# Patient Record
Sex: Female | Born: 1940 | Race: White | Hispanic: Yes | Smoking: Never smoker
Health system: Southern US, Community
[De-identification: ages and names within clinical notes are randomized; demographics above are authoritative.]

## PROBLEM LIST (undated history)

## (undated) DIAGNOSIS — T7840XA Allergy, unspecified, initial encounter: Secondary | ICD-10-CM

## (undated) DIAGNOSIS — K6289 Other specified diseases of anus and rectum: Secondary | ICD-10-CM

## (undated) DIAGNOSIS — E785 Hyperlipidemia, unspecified: Secondary | ICD-10-CM

## (undated) DIAGNOSIS — F32A Depression, unspecified: Secondary | ICD-10-CM

## (undated) DIAGNOSIS — J40 Bronchitis, not specified as acute or chronic: Secondary | ICD-10-CM

## (undated) DIAGNOSIS — K5900 Constipation, unspecified: Secondary | ICD-10-CM

## (undated) DIAGNOSIS — F329 Major depressive disorder, single episode, unspecified: Secondary | ICD-10-CM

## (undated) DIAGNOSIS — M1990 Unspecified osteoarthritis, unspecified site: Secondary | ICD-10-CM

## (undated) DIAGNOSIS — I10 Essential (primary) hypertension: Secondary | ICD-10-CM

## (undated) DIAGNOSIS — I639 Cerebral infarction, unspecified: Secondary | ICD-10-CM

## (undated) HISTORY — DX: Other specified diseases of anus and rectum: K62.89

## (undated) HISTORY — DX: Depression, unspecified: F32.A

## (undated) HISTORY — PX: PARTIAL HYSTERECTOMY: SHX80

## (undated) HISTORY — DX: Hyperlipidemia, unspecified: E78.5

## (undated) HISTORY — DX: Cerebral infarction, unspecified: I63.9

## (undated) HISTORY — DX: Constipation, unspecified: K59.00

## (undated) HISTORY — DX: Bronchitis, not specified as acute or chronic: J40

## (undated) HISTORY — PX: COLONOSCOPY: SHX174

## (undated) HISTORY — DX: Allergy, unspecified, initial encounter: T78.40XA

## (undated) HISTORY — DX: Major depressive disorder, single episode, unspecified: F32.9

## (undated) HISTORY — PX: UPPER GASTROINTESTINAL ENDOSCOPY: SHX188

## (undated) HISTORY — DX: Unspecified osteoarthritis, unspecified site: M19.90

---

## 2009-11-10 ENCOUNTER — Ambulatory Visit: Payer: Self-pay | Admitting: Internal Medicine

## 2009-11-10 LAB — CONVERTED CEMR LAB
AST: 22 units/L (ref 0–37)
Albumin: 4.6 g/dL (ref 3.5–5.2)
BUN: 18 mg/dL (ref 6–23)
Basophils Relative: 0 % (ref 0–1)
Calcium: 10.1 mg/dL (ref 8.4–10.5)
Chloride: 100 meq/L (ref 96–112)
Creatinine, Ser: 0.93 mg/dL (ref 0.40–1.20)
Eosinophils Absolute: 0 10*3/uL (ref 0.0–0.7)
Folate: >20 ng/mL
Glucose, Bld: 85 mg/dL (ref 70–99)
Hemoglobin: 15.2 g/dL — ABNORMAL HIGH (ref 12.0–15.0)
Hgb A1c MFr Bld: 5.5 % (ref <5.7)
Lymphs Abs: 2.7 10*3/uL (ref 0.7–4.0)
MCHC: 34.5 g/dL (ref 30.0–36.0)
MCV: 88.7 fL (ref 78.0–100.0)
Microalb, Ur: 2.42 mg/dL — ABNORMAL HIGH (ref 0.00–1.89)
Monocytes Absolute: 0.5 10*3/uL (ref 0.1–1.0)
Monocytes Relative: 12 % (ref 3–12)
Neutro Abs: 1.3 10*3/uL — ABNORMAL LOW (ref 1.7–7.7)
Potassium: 4 meq/L (ref 3.5–5.3)
RBC: 4.97 M/uL (ref 3.87–5.11)
Vitamin B-12: 1010 pg/mL — ABNORMAL HIGH (ref 211–911)
WBC: 4.6 10*3/uL (ref 4.0–10.5)

## 2009-11-17 ENCOUNTER — Ambulatory Visit: Payer: Self-pay | Admitting: Internal Medicine

## 2009-11-17 LAB — CONVERTED CEMR LAB
HDL: 43 mg/dL (ref >39)
LDL Cholesterol: 99 mg/dL (ref 0–99)
Total CHOL/HDL Ratio: 4.4
VLDL: 48 mg/dL — ABNORMAL HIGH (ref 0–40)

## 2009-11-24 ENCOUNTER — Ambulatory Visit: Payer: Self-pay | Admitting: Internal Medicine

## 2009-11-24 LAB — CONVERTED CEMR LAB
ALT: 13 units/L (ref 0–35)
AST: 22 units/L (ref 0–37)
Albumin: 4.4 g/dL (ref 3.5–5.2)
Alkaline Phosphatase: 66 units/L (ref 39–117)
Hgb A1c MFr Bld: 5.6 % (ref <5.7)
Potassium: 3.8 meq/L (ref 3.5–5.3)
Sodium: 142 meq/L (ref 135–145)
Total Bilirubin: 0.5 mg/dL (ref 0.3–1.2)
Total Protein: 6.8 g/dL (ref 6.0–8.3)

## 2010-03-07 ENCOUNTER — Encounter (INDEPENDENT_AMBULATORY_CARE_PROVIDER_SITE_OTHER): Payer: Self-pay | Admitting: *Deleted

## 2010-03-07 LAB — CONVERTED CEMR LAB
Basophils Absolute: 0 10*3/uL (ref 0.0–0.1)
Eosinophils Absolute: 0.1 10*3/uL (ref 0.0–0.7)
Eosinophils Relative: 2 % (ref 0–5)
HCT: 42.3 % (ref 36.0–46.0)
Hemoglobin: 14.5 g/dL (ref 12.0–15.0)
Lymphocytes Relative: 58 % — ABNORMAL HIGH (ref 12–46)
MCHC: 34.3 g/dL (ref 30.0–36.0)
MCV: 87.6 fL (ref 78.0–100.0)
Monocytes Absolute: 0.4 10*3/uL (ref 0.1–1.0)
Platelets: 233 10*3/uL (ref 150–400)
RDW: 11.9 % (ref 11.5–15.5)
Vitamin B-12: 1160 pg/mL — ABNORMAL HIGH (ref 211–911)

## 2010-04-16 ENCOUNTER — Ambulatory Visit (HOSPITAL_COMMUNITY)
Admission: RE | Admit: 2010-04-16 | Discharge: 2010-04-16 | Payer: Self-pay | Source: Home / Self Care | Attending: Family Medicine | Admitting: Family Medicine

## 2010-04-19 ENCOUNTER — Encounter (INDEPENDENT_AMBULATORY_CARE_PROVIDER_SITE_OTHER): Payer: Self-pay | Admitting: *Deleted

## 2010-05-03 NOTE — Letter (Signed)
Summary: New Patient letter  [facility]  [address] [place], [place] [address]   Phone: [phone]  Fax: [phone]       04/19/2010 MRN: [id number]  [facility] [address] [place], [place]  [address]  Dear Ms. Wool,  Welcome to the Gastroenterology Division at [facility].    You are scheduled to see Dr.  Melvia Heaps on May 25, 2010 at 3:00pm on the 3rd floor at [facility], [address].  We ask that you try to arrive at our office 15 minutes prior to your appointment time to allow for check-in.  We would like you to complete the enclosed self-administered evaluation form prior to your visit and bring it with you on the day of your appointment.  We will review it with you.  Also, please bring a complete list of all your medications or, if you prefer, bring the medication bottles and we will list them.  Please bring your insurance card so that we may make a copy of it.  If your insurance requires a referral to see a specialist, please bring your referral form from your primary care physician.  Co-payments are due at the time of your visit and may be paid by cash, check or credit card.     Your office visit will consist of a consult with your physician (includes a physical exam), any laboratory testing he/she may order, scheduling of any necessary diagnostic testing (e.g. x-ray, ultrasound, CT-scan), and scheduling of a procedure (e.g. Endoscopy, Colonoscopy) if required.  Please allow enough time on your schedule to allow for any/all of these possibilities.    If you cannot keep your appointment, please call [phone] to cancel or reschedule prior to your appointment date.  This allows [place] the opportunity to schedule an appointment for another patient in need of care.  If you do not cancel or reschedule by 5 p.m. the business day prior to your appointment date, you will be charged a $50.00 late cancellation/no-show fee.    Thank you  for choosing [facility] for your medical needs.  We appreciate the opportunity to care for you.  Please visit [place] at our website  to learn more about our practice.                     Sincerely,                                                             The Gastroenterology Division

## 2010-05-04 ENCOUNTER — Encounter (INDEPENDENT_AMBULATORY_CARE_PROVIDER_SITE_OTHER): Payer: Self-pay | Admitting: *Deleted

## 2010-05-09 NOTE — Letter (Signed)
Summary: New Patient letter  [facility]  [address] [place], [place] [address]   Phone: [phone]  Fax: [phone]       05/04/2010 MRN: [id number]  [facility] [address] [place], [place]  [address]  Dear Sabrina Wilcox,  Welcome to the Gastroenterology Division at [facility].    You are scheduled to see Dr.  Russella Dar on 05-30-10 at 10:45A.M. on the 3rd floor at [facility], [address].  We ask that you try to arrive at our office 15 minutes prior to your appointment time to allow for check-in.  We would like you to complete the enclosed self-administered evaluation form prior to your visit and bring it with you on the day of your appointment.  We will review it with you.  Also, please bring a complete list of all your medications or, if you prefer, bring the medication bottles and we will list them.  Please bring your insurance card so that we may make a copy of it.  If your insurance requires a referral to see a specialist, please bring your referral form from your primary care physician.  Co-payments are due at the time of your visit and may be paid by cash, check or credit card.     Your office visit will consist of a consult with your physician (includes a physical exam), any laboratory testing he/she may order, scheduling of any necessary diagnostic testing (e.g. x-ray, ultrasound, CT-scan), and scheduling of a procedure (e.g. Endoscopy, Colonoscopy) if required.  Please allow enough time on your schedule to allow for any/all of these possibilities.    If you cannot keep your appointment, please call [phone] to cancel or reschedule prior to your appointment date.  This allows [place] the opportunity to schedule an appointment for another patient in need of care.  If you do not cancel or reschedule by 5 p.m. the business day prior to your appointment date, you will be charged a $50.00 late cancellation/no-show fee.    Thank you for choosing  [facility] for your medical needs.  We appreciate the opportunity to care for you.  Please visit [place] at our website  to learn more about our practice.                     Sincerely,                                                             The Gastroenterology Division

## 2010-05-25 ENCOUNTER — Ambulatory Visit: Payer: Self-pay | Admitting: Gastroenterology

## 2010-05-30 ENCOUNTER — Ambulatory Visit: Payer: Self-pay | Admitting: Gastroenterology

## 2010-06-08 ENCOUNTER — Inpatient Hospital Stay (INDEPENDENT_AMBULATORY_CARE_PROVIDER_SITE_OTHER)
Admission: RE | Admit: 2010-06-08 | Discharge: 2010-06-08 | Disposition: A | Discharge status: Home / Self Care | Payer: Self-pay | Source: Ambulatory Visit | Attending: Family Medicine | Admitting: Family Medicine

## 2010-06-08 ENCOUNTER — Ambulatory Visit (INDEPENDENT_AMBULATORY_CARE_PROVIDER_SITE_OTHER): Payer: Self-pay

## 2010-06-08 DIAGNOSIS — R51 Headache: Secondary | ICD-10-CM

## 2010-06-08 LAB — POCT I-STAT, CHEM 8
BUN: 20 mg/dL (ref 6–23)
Calcium, Ion: 1.2 mmol/L (ref 1.12–1.32)
Chloride: 103 mEq/L (ref 96–112)
HCT: 40 % (ref 36.0–46.0)
Sodium: 139 mEq/L (ref 135–145)

## 2010-06-08 LAB — POCT URINALYSIS DIPSTICK
Ketones, ur: NEGATIVE mg/dL
Protein, ur: NEGATIVE mg/dL
Specific Gravity, Urine: 1.015 (ref 1.005–1.030)
Urobilinogen, UA: 0.2 mg/dL (ref 0.0–1.0)
pH: 7 (ref 5.0–8.0)

## 2010-06-09 LAB — URINE CULTURE: Colony Count: 15000

## 2010-08-15 ENCOUNTER — Ambulatory Visit (HOSPITAL_COMMUNITY)
Admission: RE | Admit: 2010-08-15 | Discharge: 2010-08-15 | Disposition: A | Discharge status: Home / Self Care | Payer: Self-pay | Source: Ambulatory Visit | Attending: Family Medicine | Admitting: Family Medicine

## 2010-08-15 ENCOUNTER — Other Ambulatory Visit (HOSPITAL_COMMUNITY): Payer: Self-pay | Admitting: Family Medicine

## 2010-08-15 DIAGNOSIS — M25569 Pain in unspecified knee: Secondary | ICD-10-CM | POA: Insufficient documentation

## 2010-08-15 DIAGNOSIS — IMO0002 Reserved for concepts with insufficient information to code with codable children: Secondary | ICD-10-CM | POA: Insufficient documentation

## 2010-08-15 DIAGNOSIS — M899 Disorder of bone, unspecified: Secondary | ICD-10-CM | POA: Insufficient documentation

## 2010-08-15 DIAGNOSIS — R52 Pain, unspecified: Secondary | ICD-10-CM

## 2010-08-15 DIAGNOSIS — M949 Disorder of cartilage, unspecified: Secondary | ICD-10-CM | POA: Insufficient documentation

## 2010-08-15 DIAGNOSIS — M1710 Unilateral primary osteoarthritis, unspecified knee: Secondary | ICD-10-CM | POA: Insufficient documentation

## 2010-11-26 ENCOUNTER — Ambulatory Visit: Payer: Self-pay | Attending: Family Medicine | Admitting: Physical Therapy

## 2010-11-26 DIAGNOSIS — M6281 Muscle weakness (generalized): Secondary | ICD-10-CM | POA: Insufficient documentation

## 2010-11-26 DIAGNOSIS — M2560 Stiffness of unspecified joint, not elsewhere classified: Secondary | ICD-10-CM | POA: Insufficient documentation

## 2010-11-26 DIAGNOSIS — IMO0001 Reserved for inherently not codable concepts without codable children: Secondary | ICD-10-CM | POA: Insufficient documentation

## 2010-11-26 DIAGNOSIS — M2550 Pain in unspecified joint: Secondary | ICD-10-CM | POA: Insufficient documentation

## 2010-11-26 DIAGNOSIS — R293 Abnormal posture: Secondary | ICD-10-CM | POA: Insufficient documentation

## 2010-12-06 ENCOUNTER — Ambulatory Visit: Payer: Self-pay | Attending: Family Medicine

## 2010-12-06 DIAGNOSIS — IMO0001 Reserved for inherently not codable concepts without codable children: Secondary | ICD-10-CM | POA: Insufficient documentation

## 2010-12-06 DIAGNOSIS — M6281 Muscle weakness (generalized): Secondary | ICD-10-CM | POA: Insufficient documentation

## 2010-12-06 DIAGNOSIS — M2560 Stiffness of unspecified joint, not elsewhere classified: Secondary | ICD-10-CM | POA: Insufficient documentation

## 2010-12-06 DIAGNOSIS — M2550 Pain in unspecified joint: Secondary | ICD-10-CM | POA: Insufficient documentation

## 2010-12-06 DIAGNOSIS — R293 Abnormal posture: Secondary | ICD-10-CM | POA: Insufficient documentation

## 2010-12-10 ENCOUNTER — Encounter: Payer: Self-pay | Admitting: Physical Therapy

## 2010-12-13 ENCOUNTER — Encounter: Payer: Self-pay | Admitting: Physical Therapy

## 2011-01-09 ENCOUNTER — Inpatient Hospital Stay (INDEPENDENT_AMBULATORY_CARE_PROVIDER_SITE_OTHER)
Admission: RE | Admit: 2011-01-09 | Discharge: 2011-01-09 | Disposition: A | Discharge status: Home / Self Care | Payer: Self-pay | Source: Ambulatory Visit | Attending: Emergency Medicine | Admitting: Emergency Medicine

## 2011-01-09 ENCOUNTER — Emergency Department (HOSPITAL_COMMUNITY): Payer: Self-pay

## 2011-01-09 ENCOUNTER — Emergency Department (HOSPITAL_COMMUNITY)
Admission: EM | Admit: 2011-01-09 | Discharge: 2011-01-10 | Disposition: A | Discharge status: Home / Self Care | Payer: Self-pay | Attending: Emergency Medicine | Admitting: Emergency Medicine

## 2011-01-09 DIAGNOSIS — R110 Nausea: Secondary | ICD-10-CM | POA: Insufficient documentation

## 2011-01-09 DIAGNOSIS — I10 Essential (primary) hypertension: Secondary | ICD-10-CM | POA: Insufficient documentation

## 2011-01-09 DIAGNOSIS — K859 Acute pancreatitis without necrosis or infection, unspecified: Secondary | ICD-10-CM | POA: Insufficient documentation

## 2011-01-09 DIAGNOSIS — R1013 Epigastric pain: Secondary | ICD-10-CM | POA: Insufficient documentation

## 2011-01-09 DIAGNOSIS — K819 Cholecystitis, unspecified: Secondary | ICD-10-CM

## 2011-01-09 HISTORY — DX: Essential (primary) hypertension: I10

## 2011-01-09 LAB — COMPREHENSIVE METABOLIC PANEL
ALT: 15 U/L (ref 0–35)
Alkaline Phosphatase: 93 U/L (ref 39–117)
CO2: 28 mEq/L (ref 19–32)
Chloride: 104 mEq/L (ref 96–112)
GFR calc Af Amer: 78 mL/min — ABNORMAL LOW (ref >90)
GFR calc non Af Amer: 67 mL/min — ABNORMAL LOW (ref >90)
Glucose, Bld: 110 mg/dL — ABNORMAL HIGH (ref 70–99)
Potassium: 4.1 mEq/L (ref 3.5–5.1)
Sodium: 141 mEq/L (ref 135–145)
Total Bilirubin: 0.4 mg/dL (ref 0.3–1.2)
Total Protein: 7.7 g/dL (ref 6.0–8.3)

## 2011-01-09 LAB — URINE MICROSCOPIC-ADD ON

## 2011-01-09 LAB — DIFFERENTIAL
Basophils Absolute: 0 10*3/uL (ref 0.0–0.1)
Basophils Relative: 1 % (ref 0–1)
Lymphocytes Relative: 44 % (ref 12–46)
Neutro Abs: 2.3 10*3/uL (ref 1.7–7.7)
Neutrophils Relative %: 46 % (ref 43–77)

## 2011-01-09 LAB — URINALYSIS, ROUTINE W REFLEX MICROSCOPIC
Bilirubin Urine: NEGATIVE
Glucose, UA: NEGATIVE mg/dL
Hgb urine dipstick: NEGATIVE
Protein, ur: NEGATIVE mg/dL
Specific Gravity, Urine: 1.009 (ref 1.005–1.030)

## 2011-01-09 LAB — CBC
HCT: 40.3 % (ref 36.0–46.0)
Hemoglobin: 14.4 g/dL (ref 12.0–15.0)
RBC: 4.77 MIL/uL (ref 3.87–5.11)

## 2011-01-10 ENCOUNTER — Encounter (HOSPITAL_COMMUNITY): Payer: Self-pay | Admitting: Radiology

## 2011-01-10 MED ORDER — IOHEXOL 300 MG/ML  SOLN
100.0000 mL | Freq: Once | INTRAMUSCULAR | Status: AC | PRN
  Administered 2011-01-10: 100 mL via INTRAVENOUS

## 2011-01-23 NOTE — H&P (Signed)
Sabrina Wilcox, Sabrina Wilcox              ACCOUNT NO.:  [id number]  MEDICAL RECORD NO.:  [id number]  LOCATION:  [facility]                         FACILITY:  [facility]  PHYSICIAN:  Juan H. Lily Peer, M.D.DATE OF BIRTH:  1940-10-18  DATE OF ADMISSION:  01/09/2011 DATE OF DISCHARGE:  01/10/2011                             HISTORY & PHYSICAL   The patient is scheduled for surgery on Friday, October 12th at 1 p.m. at [facility].  Please have history and physical available.  CHIEF COMPLAINT:  Symptomatic submucous myoma.  HISTORY:  The patient is a 70 year old gravida 0, who was seen in the office on October 17, 2010, due to the fact that her main complaint of 3- month history of left lower quadrant discomfort radiating towards her back.  She had an ultrasound at that visit which had demonstrated the following.  The uterus measured 8.4 x 5.4 x 4.1 cm with 2 small 1 cm intramural myomas and 1 subserosal measuring 1.5 cm.  Questionable intracavitary myoma was documented.  The left ovary had a solid cystic cyst measuring 3.7 x 3.3 x 3.7 cm with positive color flow within the mass and the periphery.  The patient was instructed to follow up in the office after 2 cycles to see if there has been resolution of the ovarian cyst and also did do a sonohysterogram to better assess the intrauterine cavity.  The patient stated that her symptoms had resolved and her periods sometimes may be heavy, but no intermenstrual bleeding had been reported.  On that office visit on July 18th, she had done an endometrial biopsy which demonstrated no evidence of hyperplasia or malignancy only a benign secretory endometrium.  We proceeded with doing a sonohysterogram on the office visit of September 7th which demonstrated the uterus measuring 7.7 x 5.0 x 3.9 cm with an endometrial stripe of 16.1 mm.  The patient is currently on day 10 of her cycle. Once again multiple small fibroids were noted, but the  sonohysterogram did confirm that she did have an intracavitary fibroid which measured approximately 2 cm in size.  Color flow Doppler was noted at the pedicle which was located anterior to the uterine wall.  PAST MEDICAL HISTORY:  The patient denies any allergy.  OBSTETRICAL/GYNECOLOGICAL HISTORY: 1. History of fibroid uterus. 2. Dysmenorrhea. 3. Menorrhagia.  MEDICATIONS:  Consist of iron supplementation.  MEDICAL CONDITIONS: 1. Anemia. 2. Past history of H. pylori.  No prior surgery.  FAMILY HISTORY:  Negative.  REVIEW OF SYSTEMS:  Essentially unremarkable with the exception of items mentioned in the history of present illness.  PHYSICAL EXAMINATION:  VITAL SIGNS:  The patient weighs 177 pounds, 5 feet and 3.5 inches tall.  BMI 31. HEENT:  Was unremarkable. NECK:  Supple.  Trachea midline.  No carotid bruits.  No thyromegaly. LUNGS:  Clear to auscultation.  No rhonchi or wheezes. HEART:  Regular rate and rhythm.  No murmurs or gallops. BREAST EXAM:  Not done. ABDOMEN:  Soft, nontender.  No rebound or guarding. PELVIC:  Bartholin, urethra, Skene glands within normal limits.  Vagina and cervix, no gross lesions on inspection.  Uterus slightly irregular, upper limits of normal.  No palpable adnexal masses.  RECTAL EXAM:  Deferred.  ASSESSMENT:  A 70 year old gravida 0 with submucous myoma, scheduled for resectoscopic myomectomy.  The risks, benefits, and pros and cons of the operation were discussed with the patient in Spanish to include the risk of uterine perforation, requiring emergency laparotomy, the risk of hemorrhage if she would need blood or blood products.  She is fully aware of the potential risk of anaphylactic reaction, hepatitis, and acquired immune deficiency syndrome.  Also the risk of deep venous thrombosis and pulmonary embolism.  She will receive __________stockings for deep venous thrombosis prophylaxis and also receive intravenous antibiotics as well.   All these issues were discussed with the patient in the event of uncontrollable hemorrhage and she would need emergency hysterectomy.  She is fully aware that she will not be able to have any children after this, although she is 89 years of age.  She fully understands all the above risks, literature information has previously been provided and all of her questions were answered in detail in Spanish and will follow accordingly.  PLAN:  The patient is scheduled for resectoscopic myomectomy on Friday, October 12th at 1 p.m. at [facility].  Please have history and physical available.     Juan H. Lily Peer, M.D.     JHF/MEDQ  D:  01/10/2011  T:  01/10/2011  Job:  [id number]  Electronically Signed by Reynaldo Minium M.D. on 01/23/2011 02:29:49 PM

## 2015-05-15 ENCOUNTER — Ambulatory Visit: Payer: Self-pay | Attending: Internal Medicine

## 2015-06-01 ENCOUNTER — Emergency Department (HOSPITAL_COMMUNITY)
Admission: EM | Admit: 2015-06-01 | Discharge: 2015-06-01 | Disposition: A | Discharge status: Home / Self Care | Payer: No Typology Code available for payment source | Source: Home / Self Care | Attending: Family Medicine | Admitting: Family Medicine

## 2015-06-01 DIAGNOSIS — I10 Essential (primary) hypertension: Secondary | ICD-10-CM

## 2015-06-01 LAB — POCT I-STAT, CHEM 8
BUN: 22 mg/dL — ABNORMAL HIGH (ref 6–20)
CALCIUM ION: 1.13 mmol/L (ref 1.13–1.30)
CREATININE: 0.9 mg/dL (ref 0.44–1.00)
Chloride: 101 mmol/L (ref 101–111)
GLUCOSE: 96 mg/dL (ref 65–99)
HCT: 42 % (ref 36.0–46.0)
HEMOGLOBIN: 14.3 g/dL (ref 12.0–15.0)
POTASSIUM: 3.9 mmol/L (ref 3.5–5.1)
Sodium: 140 mmol/L (ref 135–145)
TCO2: 30 mmol/L (ref 0–100)

## 2015-06-01 MED ORDER — AMLODIPINE BESYLATE 5 MG PO TABS: 5.0000 mg | ORAL_TABLET | Freq: Every day | ORAL | Status: DC

## 2015-06-01 MED ORDER — LISINOPRIL 10 MG PO TABS: 10.0000 mg | ORAL_TABLET | Freq: Every day | ORAL | Status: DC

## 2015-06-01 MED FILL — ?LISINOPRIL 10 MG TABLET: 10 | 30 days supply | Qty: 30 | Fill #0

## 2015-06-01 MED FILL — ?AMLODIPINE BESYLATE 5 MG T: 5 | 30 days supply | Qty: 30 | Fill #0

## 2015-06-01 NOTE — ED Provider Notes (Signed)
CSN: [id number]     Arrival date & time 06/01/15  1426 History   First MD Initiated Contact with Patient 06/01/15 1557     No chief complaint on file.  (Consider location/radiation/quality/duration/timing/severity/associated sxs/prior Treatment) Patient is a 75 y.o. female presenting with hypertension. The history is provided by a relative. The history is limited by a language barrier. A language interpreter was used (daughter and grdaughter trans, ).  Hypertension This is a chronic problem. Episode onset: not going back to [place], sees doctor in [place] for hbp, out of meds x 2 weeks, has appt 3/21 at [facility]. The problem has not changed since onset.Pertinent negatives include no chest pain, no headaches and no shortness of breath.    Past Medical History  Diagnosis Date  . Hypertension    No past surgical history on file. No family history on file. Social History  Substance Use Topics  . Smoking status: Not on file  . Smokeless tobacco: Not on file  . Alcohol Use: Not on file   OB History    No data available     Review of Systems  Constitutional: Negative.   Respiratory: Negative for cough, shortness of breath and wheezing.   Cardiovascular: Negative for chest pain and leg swelling.  Neurological: Negative for headaches.  All other systems reviewed and are negative.   Allergies  Review of patient's allergies indicates no known allergies.  Home Medications   Prior to Admission medications   Not on File   Meds Ordered and Administered this Visit  Medications - No data to display  BP 194/81 mmHg  Pulse 70  Temp(Src) 97.3 F (36.3 C) (Oral)  Resp 16  SpO2 99% No data found.   Physical Exam  Constitutional: She is oriented to person, place, and time. She appears well-developed and well-nourished. No distress.  Neck: Normal range of motion. Neck supple.  Cardiovascular: Normal rate, regular rhythm, normal heart sounds and intact distal pulses.    Pulmonary/Chest: Effort normal and breath sounds normal.  Musculoskeletal: She exhibits no edema.  Lymphadenopathy:    She has no cervical adenopathy.  Neurological: She is alert and oriented to person, place, and time.  Skin: Skin is warm and dry.  Nursing note and vitals reviewed.   ED Course  Procedures (including critical care time)  Labs Review Labs Reviewed - No data to display i-stat wnl.  Imaging Review No results found.   Visual Acuity Review  Right Eye Distance:   Left Eye Distance:   Bilateral Distance:    Right Eye Near:   Left Eye Near:    Bilateral Near:         MDM  No diagnosis found.  Meds ordered this encounter  Medications  . amLODipine (NORVASC) 5 MG tablet    Sig: Take 1 tablet (5 mg total) by mouth daily.    Dispense:  30 tablet    Refill:  0  . lisinopril (PRINIVIL,ZESTRIL) 10 MG tablet    Sig: Take 1 tablet (10 mg total) by mouth daily.    Dispense:  30 tablet    Refill:  0      Billy Fischer, MD 06/01/15 [phone]

## 2015-06-01 NOTE — Discharge Instructions (Signed)
Take your medicine daily, see your doctor for recheck as scheduled.

## 2015-06-01 NOTE — ED Notes (Signed)
Pt  Reports       Headache  And  Arm pain         She   Is recently  To  The  Area  From  [place]  And  She  Is  Out  Of  Her  bp  Medication   She  Has  An appt  21   March  At the  [facility]  She  Needs  meds  To  Last  Her    Until   She    pt  Reports

## 2015-06-02 ENCOUNTER — Emergency Department (HOSPITAL_COMMUNITY)
Admission: EM | Admit: 2015-06-02 | Discharge: 2015-06-03 | Disposition: A | Discharge status: Home / Self Care | Payer: No Typology Code available for payment source | Attending: Emergency Medicine | Admitting: Emergency Medicine

## 2015-06-02 DIAGNOSIS — Y998 Other external cause status: Secondary | ICD-10-CM | POA: Insufficient documentation

## 2015-06-02 DIAGNOSIS — Y9389 Activity, other specified: Secondary | ICD-10-CM | POA: Insufficient documentation

## 2015-06-02 DIAGNOSIS — N390 Urinary tract infection, site not specified: Secondary | ICD-10-CM | POA: Insufficient documentation

## 2015-06-02 DIAGNOSIS — S0990XA Unspecified injury of head, initial encounter: Secondary | ICD-10-CM | POA: Insufficient documentation

## 2015-06-02 DIAGNOSIS — W01198A Fall on same level from slipping, tripping and stumbling with subsequent striking against other object, initial encounter: Secondary | ICD-10-CM | POA: Insufficient documentation

## 2015-06-02 DIAGNOSIS — Z79899 Other long term (current) drug therapy: Secondary | ICD-10-CM | POA: Insufficient documentation

## 2015-06-02 DIAGNOSIS — Y9289 Other specified places as the place of occurrence of the external cause: Secondary | ICD-10-CM | POA: Insufficient documentation

## 2015-06-02 DIAGNOSIS — I10 Essential (primary) hypertension: Secondary | ICD-10-CM | POA: Insufficient documentation

## 2015-06-02 DIAGNOSIS — R410 Disorientation, unspecified: Secondary | ICD-10-CM | POA: Insufficient documentation

## 2015-06-02 DIAGNOSIS — S29001A Unspecified injury of muscle and tendon of front wall of thorax, initial encounter: Secondary | ICD-10-CM | POA: Insufficient documentation

## 2015-06-02 DIAGNOSIS — R05 Cough: Secondary | ICD-10-CM | POA: Insufficient documentation

## 2015-06-03 ENCOUNTER — Emergency Department (HOSPITAL_COMMUNITY): Payer: No Typology Code available for payment source

## 2015-06-03 ENCOUNTER — Encounter (HOSPITAL_COMMUNITY): Payer: Self-pay | Admitting: Emergency Medicine

## 2015-06-03 ENCOUNTER — Ambulatory Visit (HOSPITAL_COMMUNITY): Payer: No Typology Code available for payment source

## 2015-06-03 LAB — COMPREHENSIVE METABOLIC PANEL
ALBUMIN: 3.9 g/dL (ref 3.5–5.0)
ALK PHOS: 63 U/L (ref 38–126)
ALT: 16 U/L (ref 14–54)
ANION GAP: 10 (ref 5–15)
AST: 24 U/L (ref 15–41)
BILIRUBIN TOTAL: 0.4 mg/dL (ref 0.3–1.2)
BUN: 19 mg/dL (ref 6–20)
CALCIUM: 9 mg/dL (ref 8.9–10.3)
CO2: 23 mmol/L (ref 22–32)
Chloride: 105 mmol/L (ref 101–111)
Creatinine, Ser: 1.25 mg/dL — ABNORMAL HIGH (ref 0.44–1.00)
GFR calc Af Amer: 48 mL/min — ABNORMAL LOW (ref >60)
GFR calc non Af Amer: 41 mL/min — ABNORMAL LOW (ref >60)
Glucose, Bld: 118 mg/dL — ABNORMAL HIGH (ref 65–99)
Potassium: 3.9 mmol/L (ref 3.5–5.1)
Sodium: 138 mmol/L (ref 135–145)
Total Protein: 6.4 g/dL — ABNORMAL LOW (ref 6.5–8.1)

## 2015-06-03 LAB — CBC WITH DIFFERENTIAL/PLATELET
BASOS ABS: 0 10*3/uL (ref 0.0–0.1)
Basophils Relative: 0 %
EOS PCT: 0 %
Eosinophils Absolute: 0 10*3/uL (ref 0.0–0.7)
HEMATOCRIT: 39.1 % (ref 36.0–46.0)
Hemoglobin: 13.5 g/dL (ref 12.0–15.0)
LYMPHS PCT: 7 %
Lymphs Abs: 0.9 10*3/uL (ref 0.7–4.0)
MCH: 31.1 pg (ref 26.0–34.0)
MCHC: 34.5 g/dL (ref 30.0–36.0)
MCV: 90.1 fL (ref 78.0–100.0)
MONO ABS: 0.8 10*3/uL (ref 0.1–1.0)
MONOS PCT: 6 %
NEUTROS ABS: 11.7 10*3/uL — AB (ref 1.7–7.7)
Neutrophils Relative %: 87 %
PLATELETS: 186 10*3/uL (ref 150–400)
RBC: 4.34 MIL/uL (ref 3.87–5.11)
RDW: 12.2 % (ref 11.5–15.5)
WBC: 13.4 10*3/uL — ABNORMAL HIGH (ref 4.0–10.5)

## 2015-06-03 LAB — URINALYSIS, ROUTINE W REFLEX MICROSCOPIC
GLUCOSE, UA: NEGATIVE mg/dL
KETONES UR: NEGATIVE mg/dL
NITRITE: POSITIVE — AB
PH: 5.5 (ref 5.0–8.0)
Protein, ur: 100 mg/dL — AB
SPECIFIC GRAVITY, URINE: 1.021 (ref 1.005–1.030)

## 2015-06-03 LAB — URINE MICROSCOPIC-ADD ON: RBC / HPF: NONE SEEN RBC/hpf (ref 0–5)

## 2015-06-03 LAB — MAGNESIUM: Magnesium: 1.9 mg/dL (ref 1.7–2.4)

## 2015-06-03 LAB — TROPONIN I: Troponin I: <0.03 ng/mL (ref <0.031)

## 2015-06-03 LAB — LIPASE, BLOOD: LIPASE: 34 U/L (ref 11–51)

## 2015-06-03 MED ORDER — ONDANSETRON 4 MG PO TBDP
4.0000 mg | ORAL_TABLET | Freq: Once | ORAL | Status: AC
  Administered 2015-06-03: 4 mg via ORAL
  Filled 2015-06-03: qty 1

## 2015-06-03 MED ORDER — CEPHALEXIN 500 MG PO CAPS: 500.0000 mg | ORAL_CAPSULE | Freq: Three times a day (TID) | ORAL | Status: DC

## 2015-06-03 MED ORDER — SODIUM CHLORIDE 0.9 % IV BOLUS (SEPSIS)
1000.0000 mL | Freq: Once | INTRAVENOUS | Status: AC
  Administered 2015-06-03: 1000 mL via INTRAVENOUS

## 2015-06-03 MED ORDER — ONDANSETRON HCL 4 MG/2ML IJ SOLN
4.0000 mg | Freq: Once | INTRAMUSCULAR | Status: AC
  Administered 2015-06-03: 4 mg via INTRAVENOUS
  Filled 2015-06-03: qty 2

## 2015-06-03 MED ORDER — DEXTROSE 5 % IV SOLN
1.0000 g | Freq: Once | INTRAVENOUS | Status: AC
  Administered 2015-06-03: 1 g via INTRAVENOUS
  Filled 2015-06-03: qty 10

## 2015-06-03 MED ORDER — FENTANYL CITRATE (PF) 100 MCG/2ML IJ SOLN
50.0000 ug | Freq: Once | INTRAMUSCULAR | Status: AC
  Administered 2015-06-03: 50 ug via INTRAVENOUS
  Filled 2015-06-03: qty 2

## 2015-06-03 NOTE — ED Notes (Signed)
Awake. Verbally responsive. A/O x4. Resp even and unlabored. No audible adventitious breath sounds noted. ABC's intact.  

## 2015-06-03 NOTE — ED Notes (Signed)
EKG given to EDP,Knapp,I.,MD., for review.

## 2015-06-03 NOTE — Discharge Instructions (Signed)
Drink plenty of fluids. Take the antibiotics until gone. Recheck if she gets a fever, vomiting or seems worse. She needs to get a primary care doctor to manage her blood pressure, and her chronic headache and back pain.    Infeccin urinaria  (Urinary Tract Infection)  Una infeccin urinaria puede ocurrir en Clinical cytogeneticist del tracto urinario. El tracto [employer] riones, urteres, la vejiga y Geologist, engineering. La causa es un germen llamado bacteria. La infeccin urinaria mejora con antibiticos.  CUIDADOS EN EL HOGAR   Si le recetaron antibiticos, tmelos como le haya indicado el mdico. Tmelos todos, aunque se sienta mejor.  Beba gran cantidad de lquido para mantener el pis (orina) de tono claro o amarillo plido.  Evite el t, las bebidas con cafena y las bebidas gaseosas (carbonatada).  Orine con frecuencia. Evite retener la [employer].  Orine antes y despus de tener sexo (relaciones sexuales).  Si es [place], higiencese desde adelante hacia atrs despus de ir de cuerpo (mover el intestino). Use slo un papel tissue por vez. SOLICITE AYUDA DE INMEDIATO SI:   Siente dolor en la espalda.  Siente un dolor en el vientre (abdominal) muy intenso.  Tiene escalofros.  Tiene Higher education careers adviser (nuseas).  Vomita.  El ardor o las molestias al orinar no desaparecen.  Tiene fiebre.  Los sntomas no mejoran despus de 3 das. ASEGRESE DE QUE:   Comprende estas instrucciones.  Controlar su enfermedad.  Solicitar ayuda de inmediato si no mejora o si empeora.   Esta informacin no tiene Marine scientist el consejo del mdico. Asegrese de hacerle al mdico cualquier pregunta que tenga.   Document Released: 09/05/2009 Document Revised: 12/11/2011 Elsevier Interactive Patient Education [employer].

## 2015-06-03 NOTE — ED Notes (Signed)
[facility] was notified of pt's need for MRI at [facility].

## 2015-06-03 NOTE — ED Notes (Signed)
Brought in by EMS from home with c/o abdominal pain.  Per EMS, pt reported that she has been having abdominal pain with nausea and vomiting.  Pt reports that she visited Urgent care yesterday for high blood pressure---- was started on BP medications.  Pt started her prescription today and after a while, she started having upset stomach with nausea and vomiting, no diarrhea.  Pt also reports that prior to EMS' arrival at her house, pt fell on the bed frame--- has had pain to left rib cage since the fall.

## 2015-06-03 NOTE — ED Notes (Signed)
MRI at [facility] was called and notified of pt's need for MRI Brain Wo Contrast.

## 2015-06-03 NOTE — ED Provider Notes (Signed)
CSN: [id number]     Arrival date & time 06/02/15  2352 History   By signing my name below, I, [name], attest that this documentation has been prepared under the direction and in the presence of Rolland Porter, MD at [phone].  Electronically Signed: [name], ED Scribe. 06/03/2015. 12:53 AM.   Chief Complaint  Patient presents with  . Abdominal Pain  . Rib Cage Pain    The history is provided by the patient and a relative. A language interpreter was used.    HPI Comments: Sabrina Wilcox is a 75 y.o. female with a PMHx of HTN who presents to the Emergency Department here after an episode of shaking with chills then vomiting x 1 with associated nausea onset 5:00 PM this evening. 1 episode reported since time of onset. Prior to vomiting, grandson reports pt experienced ongoing cold chills resulting in her "shaking" for a period of time. At approximately 10:00 PM this evening pt sustained a fall hitting her L chest against a vacuum cleaner after "feeling like someone was chasing her". At that time, family reports confusion not recognizing family members, trouble speaking, and generalized weakness which lasted for 20 minutes. No prior history of same. No facial droop noted. Currently pt reports ongoing generalized weakness, mild but improved upper abdominal pain, HA, and back pain. However, back pain and HA has been ongoing prior to fall "for a long time". Discomfort to back is exacerbated when coughing. No alleviating factors at this time. No recent fever, chills, or diarrhea. She denies any difficulty swallowing. She is not a smoker. No alcohol consumption. Sabrina Wilcox was taken to the hospital yesterday as her blood pressure has been elevated. She admits she has been non complaint with her BP medications for some time now. She was started on blood pressure medications again. Pt currently lives with her daughter.  PCP: Luna Kitchens    Past Medical History  Diagnosis Date  . Hypertension     History reviewed. No pertinent past surgical history. History reviewed. No pertinent family history. Social History  Substance Use Topics  . Smoking status: Never Smoker   . Smokeless tobacco: None  . Alcohol Use: No   Lives with daughter  OB History    No data available     Review of Systems  Constitutional: Negative for fever and chills.  HENT: Negative for sore throat.   Respiratory: Positive for cough. Negative for shortness of breath.   Gastrointestinal: Positive for nausea, vomiting and abdominal pain. Negative for diarrhea.  Musculoskeletal: Positive for back pain.  Neurological: Positive for weakness.  Psychiatric/Behavioral: Positive for confusion.  All other systems reviewed and are negative.     Allergies  Review of patient's allergies indicates no known allergies.  Home Medications   Prior to Admission medications   Medication Sig Start Date End Date Taking? Authorizing Provider  amLODipine (NORVASC) 5 MG tablet Take 1 tablet (5 mg total) by mouth daily. 06/01/15  Yes Billy Fischer, MD  lisinopril (PRINIVIL,ZESTRIL) 10 MG tablet Take 1 tablet (10 mg total) by mouth daily. 06/01/15  Yes Billy Fischer, MD  OVER THE COUNTER MEDICATION Take 2 tablets by mouth every 8 (eight) hours as needed (for cold). Patient took cold medicine but doesn't know what kind it was.   Yes Historical Provider, MD  cephALEXin (KEFLEX) 500 MG capsule Take 1 capsule (500 mg total) by mouth 3 (three) times daily. 06/03/15   Rolland Porter, MD   Triage Vitals: BP 131/42  mmHg  Pulse 77  Temp(Src) 98.1 F (36.7 C) (Oral)  Resp 18  SpO2 98%  Vital signs normal     Physical Exam  Constitutional: She is oriented to person, place, and time. She appears well-developed and well-nourished.  Non-toxic appearance. She does not appear ill. No distress.  HENT:  Head: Normocephalic and atraumatic.  Right Ear: External ear normal.  Left Ear: External ear normal.  Nose: Nose normal. No mucosal edema or  rhinorrhea.  Mouth/Throat: Oropharynx is clear and moist and mucous membranes are normal. No dental abscesses or uvula swelling.  Eyes: Conjunctivae and EOM are normal. Pupils are equal, round, and reactive to light.  Neck: Normal range of motion and full passive range of motion without pain. Neck supple.  Cardiovascular: Normal rate, regular rhythm and normal heart sounds.  Exam reveals no gallop and no friction rub.   No murmur heard. Pulmonary/Chest: Effort normal and breath sounds normal. No respiratory distress. She has no wheezes. She has no rhonchi. She has no rales. She exhibits no tenderness and no crepitus.  Abdominal: Soft. Normal appearance and bowel sounds are normal. She exhibits no distension. There is tenderness. There is no rebound and no guarding.  Diffuse tenderness but worse in epigastric area   Musculoskeletal: Normal range of motion. She exhibits no edema or tenderness.  Moves all extremities well.   Neurological: She is alert and oriented to person, place, and time. She has normal strength. No cranial nerve deficit.  Skin: Skin is warm, dry and intact. No rash noted. No erythema. No pallor.  Psychiatric: She has a normal mood and affect. Her speech is normal and behavior is normal. Her mood appears not anxious.  Nursing note and vitals reviewed.   ED Course  Procedures (including critical care time)  Medications  sodium chloride 0.9 % bolus 1,000 mL (1,000 mLs Intravenous New Bag/Given 06/03/15 0146)  ondansetron (ZOFRAN) injection 4 mg (4 mg Intravenous Given 06/03/15 0146)  fentaNYL (SUBLIMAZE) injection 50 mcg (50 mcg Intravenous Given 06/03/15 0149)  cefTRIAXone (ROCEPHIN) 1 g in dextrose 5 % 50 mL IVPB (0 g Intravenous Stopped 06/03/15 0654)     DIAGNOSTIC STUDIES: Oxygen Saturation is 98% on RA, Normal by my interpretation.    COORDINATION OF CARE: 12:44 AM-Will order CT head without contrast, DG ribs unilateral with chest L, Magnesium, urinalysis, CMP, Lipase,  CBC, Troponin I, and EKG. Will give fluids, Sublimaze, and Zofran. Discussed treatment plan with pt at bedside and pt agreed to plan.     3:46 AM- Discussed results with family. Recommended MRI of brain for further evaluation- will order. Family agrees to plan. Pt states she is feeling fine now.   Patient was given Rocephin for her urinary tract infection.  0700 patient and her daughter were given the results of her MRI. We discussed she had a urinary tract infection and she needs take the antibiotics until gone. The etiology of her confusion with the inability to talk and the feeling that someone was chasing her for 20 minutes is unclear. However she does not appear to have had an acute stroke tonight.   Labs Review Results for orders placed or performed during the hospital encounter of 06/02/15  Comprehensive metabolic panel  Result Value Ref Range   Sodium 138 135 - 145 mmol/L   Potassium 3.9 3.5 - 5.1 mmol/L   Chloride 105 101 - 111 mmol/L   CO2 23 22 - 32 mmol/L   Glucose, Bld 118 (H) 65 - 99  mg/dL   BUN 19 6 - 20 mg/dL   Creatinine, Ser 1.25 (H) 0.44 - 1.00 mg/dL   Calcium 9.0 8.9 - 10.3 mg/dL   Total Protein 6.4 (L) 6.5 - 8.1 g/dL   Albumin 3.9 3.5 - 5.0 g/dL   AST 24 15 - 41 U/L   ALT 16 14 - 54 U/L   Alkaline Phosphatase 63 38 - 126 U/L   Total Bilirubin 0.4 0.3 - 1.2 mg/dL   GFR calc non Af Amer 41 (L) >60 mL/min   GFR calc Af Amer 48 (L) >60 mL/min   Anion gap 10 5 - 15  Lipase, blood  Result Value Ref Range   Lipase 34 11 - 51 U/L  CBC with Differential  Result Value Ref Range   WBC 13.4 (H) 4.0 - 10.5 K/uL   RBC 4.34 3.87 - 5.11 MIL/uL   Hemoglobin 13.5 12.0 - 15.0 g/dL   HCT 39.1 36.0 - 46.0 %   MCV 90.1 78.0 - 100.0 fL   MCH 31.1 26.0 - 34.0 pg   MCHC 34.5 30.0 - 36.0 g/dL   RDW 12.2 11.5 - 15.5 %   Platelets 186 150 - 400 K/uL   Neutrophils Relative % 87 %   Neutro Abs 11.7 (H) 1.7 - 7.7 K/uL   Lymphocytes Relative 7 %   Lymphs Abs 0.9 0.7 - 4.0 K/uL    Monocytes Relative 6 %   Monocytes Absolute 0.8 0.1 - 1.0 K/uL   Eosinophils Relative 0 %   Eosinophils Absolute 0.0 0.0 - 0.7 K/uL   Basophils Relative 0 %   Basophils Absolute 0.0 0.0 - 0.1 K/uL  Troponin I  Result Value Ref Range   Troponin I <0.03 <0.031 ng/mL  Urinalysis, Routine w reflex microscopic  Result Value Ref Range   Color, Urine AMBER (A) YELLOW   APPearance CLOUDY (A) CLEAR   Specific Gravity, Urine 1.021 1.005 - 1.030   pH 5.5 5.0 - 8.0   Glucose, UA NEGATIVE NEGATIVE mg/dL   Hgb urine dipstick SMALL (A) NEGATIVE   Bilirubin Urine SMALL (A) NEGATIVE   Ketones, ur NEGATIVE NEGATIVE mg/dL   Protein, ur 100 (A) NEGATIVE mg/dL   Nitrite POSITIVE (A) NEGATIVE   Leukocytes, UA LARGE (A) NEGATIVE  Magnesium  Result Value Ref Range   Magnesium 1.9 1.7 - 2.4 mg/dL  Urine microscopic-add on  Result Value Ref Range   Squamous Epithelial / LPF 0-5 (A) NONE SEEN   WBC, UA TOO NUMEROUS TO COUNT 0 - 5 WBC/hpf   RBC / HPF NONE SEEN 0 - 5 RBC/hpf   Bacteria, UA MANY (A) NONE SEEN   Laboratory interpretation all normal except possible UTI, leukocytosis     Imaging Review Dg Ribs Unilateral W/chest Left  06/03/2015  CLINICAL DATA:  75 year old female with fall and left-sided chest pain EXAM: LEFT RIBS AND CHEST - 3+ VIEW COMPARISON:  None. 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 There is no definite rib fracture. No pneumothorax identified. 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 No focal consolidation, or pleural effusion. The cardiac silhouette is within normal limits. IMPRESSION: No acute rib fracture or pneumothorax. Electronically Signed   By: Anner Crete M.D.   On: 06/03/2015 01:34   Ct Head Wo Contrast  06/03/2015  CLINICAL DATA:  Confusion, nausea and vomiting. Seen at urgent care yesterday for high blood pressure. Fell at home, struck rib cage on bed frame. EXAM: CT HEAD WITHOUT CONTRAST TECHNIQUE: Contiguous axial images were obtained  from the base of the skull  through the vertex without intravenous contrast. COMPARISON:  None. FINDINGS: The ventricles and sulci are normal for age. No intraparenchymal hemorrhage, mass effect nor midline shift. Patchy supratentorial white matter hypodensities are within normal range for patient's age and though non-specific suggest sequelae of chronic small vessel ischemic disease. No acute large vascular territory infarcts. Age-indeterminate probable RIGHT basal ganglia lacunar infarct. No abnormal extra-axial fluid collections. Basal cisterns are patent. Mild calcific atherosclerosis of the carotid siphons. No skull fracture. The included ocular globes and orbital contents are non-suspicious. Soft tissue opacifies the LEFT maxillary sinus with central calcifications consistent with chronic sinusitis. Mastoid air cells are well aerated. IMPRESSION: Age indeterminate suspected RIGHT basal ganglia lacunar infarct. Involutional changes and mild to moderate chronic small vessel ischemic disease. Electronically Signed   By: Elon Alas M.D.   On: 06/03/2015 01:41   Mr Brain Wo Contrast (neuro Protocol)  06/03/2015  CLINICAL DATA:  Posterior head pain after fall. Acute onset confusion, slurred speech. EXAM: MRI HEAD WITHOUT CONTRAST TECHNIQUE: Multiplanar, multiecho pulse sequences of the brain and surrounding structures were obtained without intravenous contrast. COMPARISON:  CT head June 03, 2015 at 0129 hours FINDINGS: The ventricles and sulci are normal for patient's age. No abnormal parenchymal signal, mass lesions, mass effect. Patchy supratentorial white matter FLAIR T2 hyperintensities. Prominent, normal basal ganglia perivascular spaces. No reduced diffusion to suggest acute ischemia. No susceptibility artifact to suggest hemorrhage. No abnormal extra-axial fluid collections. No extra-axial masses though, contrast enhanced sequences would be more sensitive. Normal major intracranial vascular flow voids seen at the skull base.  Ocular globes and orbital contents are unremarkable though not tailored for evaluation. No abnormal sellar expansion. No suspicious calvarial bone marrow signal. 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 Craniocervical junction maintained. Mild paranasal sinus mucosal thickening. Mastoid air cells are well aerated. IMPRESSION: No acute intracranial process. Mild to moderate chronic small vessel ischemic disease. Electronically Signed   By: Elon Alas M.D.   On: 06/03/2015 05:45   I have personally reviewed and evaluated these images and lab results as part of my medical decision-making.   EKG Interpretation   Date/Time:  Saturday June 03 2015 01:05:06 EST Ventricular Rate:  65 PR Interval:  153 QRS Duration: 101 QT Interval:  478 QTC Calculation: 497 R Axis:   74 Text Interpretation:  Sinus rhythm Minimal ST elevation, inferolateral  leads Borderline prolonged QT interval No old tracing to compare Confirmed  by Rupal Childress  MD-I, Breshae Belcher ([id number]) on 06/03/2015 1:08:55 AM      MDM   Final diagnoses:  UTI (lower urinary tract infection)    New Prescriptions   CEPHALEXIN (KEFLEX) 500 MG CAPSULE    Take 1 capsule (500 mg total) by mouth 3 (three) times daily.    Plan discharge  Rolland Porter, MD, FACEP   I personally performed the services described in this documentation, which was scribed in my presence. The recorded information has been reviewed and considered.  Rolland Porter, MD, Barbette Or, MD 06/03/15 [phone]

## 2015-06-03 NOTE — ED Notes (Signed)
[facility] here to transport pt to [facility].

## 2015-06-03 NOTE — ED Notes (Signed)
Pt back from [facility] per [facility].

## 2015-06-20 ENCOUNTER — Ambulatory Visit: Payer: No Typology Code available for payment source | Attending: Family Medicine | Admitting: Family Medicine

## 2015-06-20 ENCOUNTER — Encounter: Payer: Self-pay | Admitting: Family Medicine

## 2015-06-20 VITALS — BP 153/76 | HR 98 | Temp 97.6°F | Resp 16 | Ht 60.0 in | Wt 166.0 lb

## 2015-06-20 DIAGNOSIS — Z8673 Personal history of transient ischemic attack (TIA), and cerebral infarction without residual deficits: Secondary | ICD-10-CM

## 2015-06-20 DIAGNOSIS — I10 Essential (primary) hypertension: Secondary | ICD-10-CM

## 2015-06-20 DIAGNOSIS — Z23 Encounter for immunization: Secondary | ICD-10-CM

## 2015-06-20 DIAGNOSIS — R1030 Lower abdominal pain, unspecified: Secondary | ICD-10-CM | POA: Insufficient documentation

## 2015-06-20 DIAGNOSIS — G8929 Other chronic pain: Secondary | ICD-10-CM

## 2015-06-20 DIAGNOSIS — R1031 Right lower quadrant pain: Secondary | ICD-10-CM

## 2015-06-20 DIAGNOSIS — E785 Hyperlipidemia, unspecified: Secondary | ICD-10-CM

## 2015-06-20 DIAGNOSIS — M549 Dorsalgia, unspecified: Secondary | ICD-10-CM

## 2015-06-20 DIAGNOSIS — K5900 Constipation, unspecified: Secondary | ICD-10-CM

## 2015-06-20 DIAGNOSIS — Z79899 Other long term (current) drug therapy: Secondary | ICD-10-CM | POA: Insufficient documentation

## 2015-06-20 DIAGNOSIS — K5909 Other constipation: Secondary | ICD-10-CM | POA: Insufficient documentation

## 2015-06-20 DIAGNOSIS — E2839 Other primary ovarian failure: Secondary | ICD-10-CM

## 2015-06-20 DIAGNOSIS — R1032 Left lower quadrant pain: Secondary | ICD-10-CM

## 2015-06-20 DIAGNOSIS — Z0000 Encounter for general adult medical examination without abnormal findings: Secondary | ICD-10-CM

## 2015-06-20 LAB — HEMOCCULT GUIAC POC 1CARD (OFFICE): Fecal Occult Blood, POC: NEGATIVE

## 2015-06-20 LAB — POCT GLYCOSYLATED HEMOGLOBIN (HGB A1C): Hemoglobin A1C: 5

## 2015-06-20 MED ORDER — VITAMIN D3 50 MCG (2000 UT) PO TABS: 2000.0000 [IU] | ORAL_TABLET | Freq: Every day | ORAL | Status: AC

## 2015-06-20 MED ORDER — ASPIRIN EC 81 MG PO TBEC
81.0000 mg | DELAYED_RELEASE_TABLET | Freq: Every day | ORAL | Status: DC
Start: 2015-06-20 — End: 2016-03-05

## 2015-06-20 MED ORDER — POLYETHYLENE GLYCOL 3350 17 GM/SCOOP PO POWD: 17.0000 g | Freq: Every day | ORAL | Status: AC

## 2015-06-20 MED ORDER — ACETAMINOPHEN ER 650 MG PO TBCR: 650.0000 mg | EXTENDED_RELEASE_TABLET | Freq: Three times a day (TID) | ORAL | Status: DC | PRN

## 2015-06-20 MED ORDER — LISINOPRIL 20 MG PO TABS: 20.0000 mg | ORAL_TABLET | Freq: Every day | ORAL | Status: DC

## 2015-06-20 MED ORDER — AMLODIPINE BESYLATE 10 MG PO TABS: 10.0000 mg | ORAL_TABLET | Freq: Every day | ORAL | Status: DC

## 2015-06-20 MED ORDER — CALCIUM CITRATE 200 MG PO TABS
400.0000 mg | ORAL_TABLET | Freq: Every day | ORAL | Status: AC
Start: 2015-06-20 — End: ?

## 2015-06-20 NOTE — Assessment & Plan Note (Signed)
A; chronic pain with spine curvature concerning for osteoporosis P: Tylenol Vit D and calcium DEXA scan

## 2015-06-20 NOTE — Progress Notes (Signed)
LOGO@  Subjective:  Patient ID: Sabrina Wilcox, female    DOB: 1940/07/03  Age: 75 y.o. MRN: [id number]  CC: No chief complaint on file.   HPI Penda Coblentz presents for    1. Abdomina pain: b/l lower abdominal pain for past 10 years. She has chronic hard stools. She has had normal colonoscopy in [place] x 2. No fever, chills, weight loss, no blood in stool.   2. Chronic HTN: she takes Norvasc 5 mg and lisinopril 10 mg. She had a syncopal episode while vacuuming and ended up in ED 3 weeks ago. This was in the setting of no meds x 2 weeks. No HA, CP, SOB. Admits to poor vision.   3. Recent syncope: 3 weeks ago while vacuuming. BP was high. She had CT head that revealed age undetermined R basal ganglia stroke. MRI was negative for acute stroke. She feel on her L side. CXR negative for rib fracture.   Past Medical History  Diagnosis Date  . Hypertension    Current Outpatient Prescriptions on File Prior to Visit  Medication Sig Dispense Refill  . OVER THE COUNTER MEDICATION Take 2 tablets by mouth every 8 (eight) hours as needed (for cold). Reported on 06/20/2015     No current facility-administered medications on file prior to visit.    History reviewed. No pertinent past surgical history.  History reviewed. No pertinent family history.  Social History  Substance Use Topics  . Smoking status: Never Smoker   . Smokeless tobacco: Not on file  . Alcohol Use: No    ROS Review of Systems  Constitutional: Negative for fever and chills.  HENT: Positive for hearing loss.   Eyes: Positive for visual disturbance.  Respiratory: Negative for shortness of breath.   Cardiovascular: Negative for chest pain.  Gastrointestinal: Positive for abdominal pain, diarrhea and constipation. Negative for nausea, vomiting, blood in stool, abdominal distention and rectal pain.  Musculoskeletal: Positive for back pain. Negative for arthralgias.  Skin: Negative for rash.    Allergic/Immunologic: Negative for immunocompromised state.  Hematological: Negative for adenopathy. Does not bruise/bleed easily.  Psychiatric/Behavioral: Negative for suicidal ideas and dysphoric mood.    Objective:   Today's Vitals: BP 153/76 mmHg  Pulse 98  Temp(Src) 97.6 F (36.4 C) (Oral)  Resp 16  Ht 5' (1.524 m)  Wt 166 lb (75.297 kg)  BMI 32.42 kg/m2  SpO2 98%  Physical Exam  Constitutional: She is oriented to person, place, and time. She appears well-developed and well-nourished. No distress.  HENT:  Head: Normocephalic and atraumatic.  Cardiovascular: Normal rate, regular rhythm, normal heart sounds and intact distal pulses.   Pulmonary/Chest: Effort normal and breath sounds normal.  Genitourinary:  Heme negative stools   Musculoskeletal: She exhibits edema (trace edema in lower legs ).       Back:  Neurological: She is alert and oriented to person, place, and time.  Skin: Skin is warm and dry. No rash noted.  Psychiatric: She has a normal mood and affect.   Lab Results  Component Value Date   HGBA1C 5.6 11/24/2009    Assessment & Plan:   Problem List Items Addressed This Visit    History of stroke (Chronic)   Relevant Medications   aspirin EC 81 MG tablet   Other Relevant Orders   Lipid panel   HgB A1c   Essential hypertension - Primary (Chronic)   Relevant Medications   amLODipine (NORVASC) 10 MG tablet   lisinopril (PRINIVIL,ZESTRIL) 20 MG tablet  aspirin EC 81 MG tablet   Other Relevant Orders   Basic Metabolic Panel   Chronic constipation (Chronic)   Relevant Medications   polyethylene glycol powder (GLYCOLAX/MIRALAX) powder   Chronic bilateral lower abdominal pain (Chronic)   Relevant Medications   aspirin EC 81 MG tablet   acetaminophen (TYLENOL 8 HOUR) 650 MG CR tablet   Other Relevant Orders   Hemoccult - 1 Card (office) (Completed)   Chronic back pain (Chronic)   Relevant Medications   aspirin EC 81 MG tablet   acetaminophen  (TYLENOL 8 HOUR) 650 MG CR tablet   Calcium Citrate 200 MG TABS   Cholecalciferol (VITAMIN D3) 2000 units TABS    Other Visit Diagnoses    Healthcare maintenance        Relevant Orders    Ambulatory referral to Gastroenterology    Estrogen deficiency        Relevant Orders    DG Bone Density       Outpatient Encounter Prescriptions as of 06/20/2015  Medication Sig  . amLODipine (NORVASC) 10 MG tablet Take 1 tablet (10 mg total) by mouth daily.  Marland Kitchen lisinopril (PRINIVIL,ZESTRIL) 20 MG tablet Take 1 tablet (20 mg total) by mouth daily.  . [DISCONTINUED] amLODipine (NORVASC) 5 MG tablet Take 1 tablet (5 mg total) by mouth daily.  . [DISCONTINUED] lisinopril (PRINIVIL,ZESTRIL) 10 MG tablet Take 1 tablet (10 mg total) by mouth daily.  Marland Kitchen OVER THE COUNTER MEDICATION Take 2 tablets by mouth every 8 (eight) hours as needed (for cold). Reported on 06/20/2015  . [DISCONTINUED] cephALEXin (KEFLEX) 500 MG capsule Take 1 capsule (500 mg total) by mouth 3 (three) times daily.   No facility-administered encounter medications on file as of 06/20/2015.    Follow-up: No Follow-up on file.    Boykin Nearing MD

## 2015-06-20 NOTE — Assessment & Plan Note (Signed)
A: HTN hx of stroke P: norvasc increase to 10 mg, monitor legs for swelling Lisinopril increase to 20 mg, monitor Cr

## 2015-06-20 NOTE — Progress Notes (Signed)
C/C abdominal pain  Diarrhea x 1 week s Stated unable to eat with out pain  Pain scale # 0 No tobacco user  No suicidal thoughts in the past two weeks

## 2015-06-20 NOTE — Patient Instructions (Addendum)
Diagnoses and all orders for this visit:  Essential hypertension -     amLODipine (NORVASC) 10 MG tablet; Take 1 tablet (10 mg total) by mouth daily. -     lisinopril (PRINIVIL,ZESTRIL) 20 MG tablet; Take 1 tablet (20 mg total) by mouth daily.  History of stroke -     aspirin EC 81 MG tablet; Take 1 tablet (81 mg total) by mouth daily. -     Lipid panel -     HgB A1c  Chronic bilateral lower abdominal pain -     Hemoccult - 1 Card (office)  Chronic constipation -     polyethylene glycol powder (GLYCOLAX/MIRALAX) powder; Take 17 g by mouth daily.  Chronic back pain -     acetaminophen (TYLENOL 8 HOUR) 650 MG CR tablet; Take 1 tablet (650 mg total) by mouth every 8 (eight) hours as needed for pain. -     Calcium Citrate 200 MG TABS; Take 2 tablets (400 mg total) by mouth daily. -     Cholecalciferol (VITAMIN D3) 2000 units TABS; Take 2,000 Units by mouth daily.  Healthcare maintenance -     Ambulatory referral to Gastroenterology  Estrogen deficiency -     DG Bone Density; Future    Increase norvasc to 10 mg daily Increase lisinopril to 20 mg daily   Goal BP is < 150/90 at all times  F/u in 6 weeks for HTN  Dr. Adrian Blackwater

## 2015-06-20 NOTE — Assessment & Plan Note (Signed)
A: hx of stroke in HTN P: Lipids A1c Control BP Daily ASA

## 2015-06-20 NOTE — Assessment & Plan Note (Signed)
miralax

## 2015-06-20 NOTE — Assessment & Plan Note (Signed)
A: chronic abdominal pain with constipation. Heme neg stools and normal abdominal exam P: miralax GI referral for screening colonoscopy

## 2015-06-20 NOTE — Addendum Note (Signed)
Addended by: Betti Cruz on: 06/20/2015 05:59 PM   Modules accepted: Orders

## 2015-06-21 LAB — LIPID PANEL
CHOLESTEROL: 172 mg/dL (ref 125–200)
HDL: 45 mg/dL — ABNORMAL LOW (ref >=46)
LDL CALC: 66 mg/dL (ref <130)
TRIGLYCERIDES: 307 mg/dL — AB (ref <150)
Total CHOL/HDL Ratio: 3.8 Ratio (ref <=5.0)
VLDL: 61 mg/dL — AB (ref <30)

## 2015-06-21 LAB — BASIC METABOLIC PANEL
BUN: 15 mg/dL (ref 7–25)
CO2: 25 mmol/L (ref 20–31)
CREATININE: 1.01 mg/dL — AB (ref 0.60–0.93)
Calcium: 9.1 mg/dL (ref 8.6–10.4)
Chloride: 100 mmol/L (ref 98–110)
Glucose, Bld: 94 mg/dL (ref 65–99)
POTASSIUM: 4.8 mmol/L (ref 3.5–5.3)
Sodium: 139 mmol/L (ref 135–146)

## 2015-06-22 DIAGNOSIS — E785 Hyperlipidemia, unspecified: Secondary | ICD-10-CM | POA: Insufficient documentation

## 2015-06-22 MED ORDER — ATORVASTATIN CALCIUM 40 MG PO TABS: 40.0000 mg | ORAL_TABLET | Freq: Every day | ORAL | Status: DC

## 2015-06-22 MED FILL — LISINOPRIL 20 MG TABLET: 20 | 30 days supply | Qty: 30 | Fill #0

## 2015-06-22 MED FILL — POLYETHYLENE GLYCOL 3350: 30 days supply | Qty: 510 | Fill #0

## 2015-06-22 MED FILL — AMLODIPINE BESYLATE 10 MG T: 10 | 30 days supply | Qty: 30 | Fill #0

## 2015-06-22 MED FILL — ATORVASTATIN 40 MG TABLET: 40 | 30 days supply | Qty: 30 | Fill #0

## 2015-06-22 NOTE — Addendum Note (Signed)
Addended by: Boykin Nearing on: 06/22/2015 01:18 PM   Modules accepted: Orders, SmartSet

## 2015-06-29 ENCOUNTER — Telehealth: Payer: Self-pay | Admitting: *Deleted

## 2015-06-29 NOTE — Telephone Encounter (Signed)
-----   Message from Boykin Nearing, MD sent at 06/22/2015  1:16 PM EDT ----- Improved Cr  Elevated cholesterol History of stroke Start lipitor 40 mg daily

## 2015-06-29 NOTE — Telephone Encounter (Signed)
Pt. Daughter returned call. Please f/u °

## 2015-06-29 NOTE — Telephone Encounter (Signed)
Unable to LVM "Pt you call has a voice mail that in not set up yet"

## 2015-07-03 NOTE — Telephone Encounter (Signed)
Date of birth verified by pt  Lab results given  Pt verbalized understanding  Information given in [place]

## 2015-07-03 NOTE — Telephone Encounter (Signed)
Left message with Daughter to return call

## 2015-07-03 NOTE — Telephone Encounter (Signed)
Patients daughter returned phone call, she would like a call back at [phone]

## 2015-07-05 ENCOUNTER — Ambulatory Visit: Payer: No Typology Code available for payment source | Attending: Family Medicine

## 2015-07-19 ENCOUNTER — Encounter (HOSPITAL_COMMUNITY): Payer: Self-pay

## 2015-07-19 ENCOUNTER — Ambulatory Visit (HOSPITAL_COMMUNITY)
Admission: EM | Admit: 2015-07-19 | Discharge: 2015-07-19 | Disposition: A | Discharge status: Home / Self Care | Payer: No Typology Code available for payment source | Attending: Family Medicine | Admitting: Family Medicine

## 2015-07-19 DIAGNOSIS — K5901 Slow transit constipation: Secondary | ICD-10-CM

## 2015-07-19 DIAGNOSIS — T887XXA Unspecified adverse effect of drug or medicament, initial encounter: Secondary | ICD-10-CM

## 2015-07-19 DIAGNOSIS — I10 Essential (primary) hypertension: Secondary | ICD-10-CM

## 2015-07-19 DIAGNOSIS — R600 Localized edema: Secondary | ICD-10-CM

## 2015-07-19 DIAGNOSIS — T50905A Adverse effect of unspecified drugs, medicaments and biological substances, initial encounter: Secondary | ICD-10-CM

## 2015-07-19 LAB — POCT I-STAT, CHEM 8
BUN: 17 mg/dL (ref 6–20)
CALCIUM ION: 1.14 mmol/L (ref 1.13–1.30)
CHLORIDE: 99 mmol/L — AB (ref 101–111)
CREATININE: 1.2 mg/dL — AB (ref 0.44–1.00)
GLUCOSE: 111 mg/dL — AB (ref 65–99)
HCT: 41 % (ref 36.0–46.0)
Hemoglobin: 13.9 g/dL (ref 12.0–15.0)
Potassium: 4.4 mmol/L (ref 3.5–5.1)
Sodium: 139 mmol/L (ref 135–145)
TCO2: 29 mmol/L (ref 0–100)

## 2015-07-19 MED ORDER — HYDROCHLOROTHIAZIDE 12.5 MG PO TABS: 12.5000 mg | ORAL_TABLET | Freq: Every day | ORAL | Status: DC

## 2015-07-19 MED FILL — ?HYDROCHLOROTHIAZIDE 12.5MG: 12.5 | 30 days supply | Qty: 30 | Fill #0

## 2015-07-19 NOTE — Discharge Instructions (Signed)
Hipertensin (Hypertension) La hipertensin, conocida comnmente como presin arterial alta, se produce cuando la sangre bombea en las arterias con mucha fuerza. Las arterias son los vasos sanguneos que transportan la sangre desde el corazn hacia todas las partes del cuerpo. Una lectura de la presin arterial consiste en un nmero ms alto sobre un nmero ms bajo, por ejemplo, 110/72. El nmero ms alto (presin sistlica) corresponde a la presin interna de las arterias cuando el corazn [name]. El nmero ms bajo (presin diastlica) corresponde a la presin interna de las arterias cuando el corazn se relaja. En condiciones ideales, la presin arterial debe ser inferior a 120/80. La hipertensin fuerza al corazn a trabajar ms para [name]. Las arterias pueden estrecharse o ponerse rgidas. La hipertensin no tratada o no controlada puede causar infarto de miocardio, ictus, enfermedad renal y otros problemas. [place] de riesgo de hipertensin son controlables, pero otros no lo son.  Entre los factores de riesgo que usted no puede Chief Technology Officer, se [employer] siguientes:   La raza. El riesgo es mayor para las Retail banker.  La edad. Los riesgos aumentan con la edad.  El sexo. Antes de los 45aos, los hombres corren ms [name]. Despus de los 65aos, las mujeres corren ms [name]. Entre los factores de riesgo que usted puede Chief Technology Officer, se [employer] siguientes:  No hacer la cantidad suficiente de actividad fsica o ejercicio.  Tener sobrepeso.  Consumir mucha grasa, azcar, caloras o sal en la dieta.  Beber alcohol en exceso. SIGNOS Y SNTOMAS Por lo general, la hipertensin no causa signos o sntomas. La hipertensin arterial demasiado alta (crisis hipertensiva) puede causar dolor de cabeza, ansiedad, falta de aire y hemorragia nasal. DIAGNSTICO Para detectar si usted tiene hipertensin, el  mdico le medir la presin arterial mientras est sentado, con el brazo levantado a la altura del corazn. Debe medirla al [name] veces en el mismo brazo. Determinadas condiciones pueden causar una diferencia de presin arterial entre el brazo izquierdo y [name]. El hecho de tener una sola lectura de la presin arterial ms alta que lo normal no significa que Stage manager. Si no est claro si tiene hipertensin arterial, es posible que se le pida que regrese otro da para volver a controlarle la presin arterial. O bien se le puede pedir que se controle la presin arterial en su casa durante 1 o ms meses. [place] hipertensin arterial incluye hacer cambios en el estilo de vida y, posiblemente, tomar medicamentos. Un estilo de vida saludable puede ayudar a bajar la presin arterial alta. Quiz deba cambiar algunos hbitos. Los cambios en el estilo de vida pueden incluir lo siguiente:  Seguir la dieta DASH. Esta dieta tiene un alto contenido de frutas, verduras y Psychologist, prison and probation services. Incluye poca cantidad de sal, carnes rojas y azcares agregados.  Mantenga el consumo de sodio por debajo de 2300 mg por da.  Realizar al [employer] 30 y 13 minutos de ejercicio [place], 4 veces por semana como mnimo.  Perder peso, si es necesario.  No fumar.  Limitar el consumo de bebidas alcohlicas.  Aprender formas de reducir el estrs. El mdico puede recetarle medicamentos si los cambios en el estilo de vida no son suficientes para Child psychotherapist la presin arterial y si una de las siguientes afirmaciones es verdadera:  [name] 37 y 14 aos y su presin arterial sistlica est por encima de 140.  Tiene  60 aos o ms y su presin arterial sistlica est por encima de 150.  Su presin arterial diastlica est por encima de 90.  Tiene diabetes y su presin arterial sistlica est por encima de 140 o su presin arterial diastlica est por encima de  90.  Tiene una enfermedad renal y su presin arterial est por encima de 140/90.  Tiene una enfermedad cardaca y su presin arterial est por encima de 140/90. La presin arterial deseada puede variar en funcin de las enfermedades, la edad y otros factores personales. INSTRUCCIONES PARA EL CUIDADO EN EL HOGAR  Haga que le midan de nuevo la presin arterial segn las indicaciones del [place] los medicamentos solamente como se lo haya indicado el mdico. Siga cuidadosamente las indicaciones. Los medicamentos para la presin arterial deben tomarse segn las indicaciones. Los medicamentos pierden eficacia al omitir las dosis. El hecho de omitir las dosis tambin [place] el riesgo de otros problemas.  No fume.  Contrlese la presin arterial en su casa segn las indicaciones del mdico. SOLICITE ATENCIN MDICA SI:   Piensa que tiene una reaccin alrgica a los medicamentos.  Tiene mareos o dolores de cabeza con Scientist, research (physical sciences).  Tiene hinchazn en los tobillos.  Tiene problemas de visin. SOLICITE ATENCIN MDICA DE INMEDIATO SI:  Siente un dolor de cabeza intenso o confusin.  Siente debilidad inusual, adormecimiento o que Geneticist, molecular.  Siente dolor intenso en el pecho o en el abdomen.  Vomita repetidas veces.  Tiene dificultad para respirar. ASEGRESE DE QUE:   Comprende estas instrucciones.  Controlar su afeccin.  Recibir ayuda de inmediato si no mejora o si empeora.   Esta informacin no tiene Marine scientist el consejo del mdico. Asegrese de hacerle al mdico cualquier pregunta que tenga.   Document Released: 03/18/2005 Document Revised: 08/02/2014 Elsevier Interactive Patient Education 2016 Elsevier Inc.  Edema perifrico (Peripheral Edema) Stop taking the amlodipine and start taking the hydrochlorothiazide new medication. Elevate lower extremities. Usted sufre hinchazn en las piernas, lo que se denomina edema perifrico. Esta hinchazn se debe al  exceso de acumulacin de sal y agua en el organismo. El edema puede ser un signo de enfermedad cardaca, renal o heptica, o el efecto secundario de un medicamento. Tambin puede deberse a otros problemas en las venas de las piernas. Si el problema se debe a una circulacin venosa deficiente, eleve las piernas y [place] medias especiales de soporte. Evite permanecer de pie durante largos perodos. El tratamiento depende de la causa. Los chips, pickles y otros alimentos salados debern evitarse. Casi siempre es necesario restringir la sal en la dieta. Le indicarn diurticos para eliminar el exceso de sal y agua del organismo por la [place]. Estos medicamentos evitan que el rin absorba el sodio. Aumentan el flujo de [place]. El tratamiento con diurticos tambin [employer] niveles de potasio del [place]. Ser necesario que utilice suplementos de potasio si toma diurticos [employer]. Controle el peso diario para verificar los progresos en la mejora del edema. Comunquese con su mdico para realizar un control segn las indicaciones. SOLICITE ATENCIN MDICA DE INMEDIATO SI:  Aumenta en gran medida la hinchazn, el dolor, la inflamacin o el calor en las piernas.  Le falta el aire, especialmente estando [place].  Siente dolor en el pecho o en el abdomen, debilidad, o se marea.  Tiene fiebre.   Esta informacin no tiene Marine scientist el consejo del mdico. Asegrese de hacerle al mdico cualquier pregunta que tenga.   Document Released:  03/18/2005 Document Revised: 06/10/2011 Elsevier Interactive Patient Education 2016 [place] (Constipation, Adult) MiraLAXuse as directed for constipation. Estreimiento significa que una persona tiene menos de tres evacuaciones en una semana, dificultad para defecar, o que las heces son secas, duras, o ms grandes que lo normal. A medida que envejecemos el estreimiento es ms comn. Una dieta baja en fibra, no tomar  suficientes lquidos y el uso de ciertos medicamentos pueden Agricultural engineer.  CAUSAS   Ciertos medicamentos, como los antidepresivos, analgsicos, suplementos de hierro, anticidos y diurticos.  Algunas enfermedades, como la diabetes, el sndrome del colon irritable, enfermedad de la tiroides, o depresin.  No beber suficiente agua.  No consumir suficientes alimentos ricos en fibra.  Situaciones de estrs o viajes.  Falta de actividad fsica o de ejercicio.  Ignorar la necesidad sbita de Landscape architect.  Uso en exceso de laxantes. SIGNOS Y SNTOMAS   Defecar menos de tres veces por semana.  Dificultad para defecar.  Tener las heces secas y duras, o ms grandes que las normales.  Sensacin de estar lleno o hinchado.  Dolor en la parte baja del abdomen.  No sentir alivio despus de defecar. DIAGNSTICO  El mdico le har una historia clnica y un examen fsico. Pueden hacerle exmenes adicionales para el estreimiento grave. Estos estudios pueden ser:  Un radiografa con enema de bario para examinar el recto, el colon y, en algunos casos, el intestino delgado.  Una sigmoidoscopia para examinar el colon inferior.  Una colonoscopia para examinar todo el colon. TRATAMIENTO  El tratamiento depender de la gravedad del estreimiento y de la causa. Algunos tratamientos nutricionales son beber ms lquidos y comer ms alimentos ricos en fibra. El cambio en el estilo de vida incluye hacer ejercicios de [place] regular. Si estas recomendaciones para Animator dieta y en el estilo de vida no ayudan, el mdico le puede indicar el uso de laxantes de venta libre para ayudarlo a Landscape architect. Los medicamentos recetados se pueden prescribir si los medicamentos de venta libre no lo [place].  INSTRUCCIONES PARA EL CUIDADO EN EL HOGAR   Consuma alimentos con alto contenido de [place], como frutas, vegetales, cereales integrales y porotos.  Limite los alimentos procesados ricos en  grasas y azcar, como las papas fritas, hamburguesas, galletas, dulces y refrescos.  Puede agregar un suplemento de fibra a su dieta si no obtiene lo suficiente de los alimentos.  Beba suficiente lquido para Consulting civil engineer orina clara o de color amarillo plido.  Haga ejercicio regularmente o segn las indicaciones del mdico.  Vaya al bao cuando sienta la necesidad de ir. No se aguante las ganas.  Tome solo medicamentos de venta libre o recetados, segn las indicaciones del mdico. No tome otros medicamentos para el estreimiento sin consultarlo antes con su mdico. SOLICITE ATENCIN MDICA DE INMEDIATO SI:   Observa sangre brillante en las heces.  El estreimiento dura ms de 4 das o [place].  Siente dolor abdominal o rectal.  Las heces son delgadas como un lpiz.  Pierde peso de [place] inexplicable. ASEGRESE DE QUE:   Comprende estas instrucciones.  Controlar su afeccin.  Recibir ayuda de inmediato si no mejora o si empeora.   Esta informacin no tiene Marine scientist el consejo del mdico. Asegrese de hacerle al mdico cualquier pregunta que tenga.   Document Released: 04/07/2007 Document Revised: 04/08/2014 Elsevier Interactive Patient Education [employer].

## 2015-07-19 NOTE — ED Notes (Addendum)
Patient presents with bilateral foot edema x2 weeks pain increases at night, patient has hypertension. Patient complains of abdominal pain No acute distress Daughter at Bedside

## 2015-07-19 NOTE — ED Provider Notes (Signed)
CSN: [id number]     Arrival date & time 07/19/15  1256 History   First MD Initiated Contact with Patient 07/19/15 1314     Chief Complaint  Patient presents with  . Leg Swelling   (Consider location/radiation/quality/duration/timing/severity/associated sxs/prior Treatment) HPI Comments: 37 year oldfemale Sabrina Wilcox presents with complaints of bilateral lower extremity and pedal edema that started approximately 2 weeks ago.It is not associated with chest pain, heaviness, tightness, fullness or pressure. She thinks that sometimes she may have orthopnea but her response is somewhat vague. Otherwise denies shortness of breath. She is also complaining of abdominal discomfort, generalizedand constipation in the past several days. Her complaints are primarilyof constipation. She was seen in the urgent care on March 2  To be treated for hypertension. She was placed on amlodipine and lisinopril. She has improvement of her blood pressure.  The history is provided by the patient. The history is limited by a language barrier. A language interpreter was used.    Past Medical History  Diagnosis Date  . Hypertension    History reviewed. No pertinent past surgical history. No family history on file. Social History  Substance Use Topics  . Smoking status: Never Smoker   . Smokeless tobacco: None  . Alcohol Use: No   OB History    No data available     Review of Systems  Constitutional: Positive for appetite change. Negative for fever, diaphoresis and activity change.  HENT: Positive for postnasal drip.   Respiratory: Negative for cough, chest tightness and wheezing.   Cardiovascular: Positive for leg swelling. Negative for chest pain and palpitations.  Gastrointestinal: Positive for abdominal pain and constipation. Negative for vomiting.  Genitourinary: Negative.   Musculoskeletal: Negative.   Neurological: Negative.   Psychiatric/Behavioral: Negative.     Allergies  Review of  patient's allergies indicates no known allergies.  Home Medications   Prior to Admission medications   Medication Sig Start Date End Date Taking? Authorizing Provider  lisinopril (PRINIVIL,ZESTRIL) 20 MG tablet Take 1 tablet (20 mg total) by mouth daily. 06/20/15  Yes Josalyn Funches, MD  acetaminophen (TYLENOL 8 HOUR) 650 MG CR tablet Take 1 tablet (650 mg total) by mouth every 8 (eight) hours as needed for pain. 06/20/15   Boykin Nearing, MD  aspirin EC 81 MG tablet Take 1 tablet (81 mg total) by mouth daily. 06/20/15   Josalyn Funches, MD  atorvastatin (LIPITOR) 40 MG tablet Take 1 tablet (40 mg total) by mouth daily. 06/22/15   Josalyn Funches, MD  Calcium Citrate 200 MG TABS Take 2 tablets (400 mg total) by mouth daily. 06/20/15   Josalyn Funches, MD  Cholecalciferol (VITAMIN D3) 2000 units TABS Take 2,000 Units by mouth daily. 06/20/15   Josalyn Funches, MD  hydrochlorothiazide (HYDRODIURIL) 12.5 MG tablet Take 1 tablet (12.5 mg total) by mouth daily. 07/19/15   Janne Napoleon, NP  OVER THE COUNTER MEDICATION Take 2 tablets by mouth every 8 (eight) hours as needed (for cold). Reported on 06/20/2015    Historical Provider, MD  polyethylene glycol powder (GLYCOLAX/MIRALAX) powder Take 17 g by mouth daily. 06/20/15   Boykin Nearing, MD   Meds Ordered and Administered this Visit  Medications - No data to display  BP 103/73 mmHg  Pulse 80  Temp(Src) 98.1 F (36.7 C) (Oral)  Resp 18  SpO2 99% No data found.   Physical Exam  Constitutional: She is oriented to person, place, and time. She appears well-developed and well-nourished. No distress.  Eyes: EOM are normal.  Neck: Normal range of motion. Neck supple.  No JVD. Carotid pulses 2+.  Cardiovascular: Normal rate, regular rhythm and intact distal pulses.   Murmur heard. Pulmonary/Chest: Effort normal and breath sounds normal. No respiratory distress. She has no wheezes. She has no rales.  Abdominal: Soft. Bowel sounds are normal. There is no  rebound and no guarding.  Mild generalized abdominal pain.  Musculoskeletal: Normal range of motion. She exhibits edema.  2+pitting edema to the bilateral lower extremities all below the knee. Pedal pulses 2+. Tenderness over the areas of swelling. No calf tenderness. Both lower extremities have equal amount of edema.  Lymphadenopathy:    She has no cervical adenopathy.  Neurological: She is alert and oriented to person, place, and time. She exhibits normal muscle tone.  Skin: Skin is warm and dry. No erythema.  Psychiatric: She has a normal mood and affect.  Nursing note and vitals reviewed.   ED Course  Procedures (including critical care time)  Labs Review Labs Reviewed  POCT I-STAT, CHEM 8 - Abnormal; Notable for the following:    Chloride 99 (*)    Creatinine, Ser 1.20 (*)    Glucose, Bld 111 (*)    All other components within normal limits   Results for orders placed or performed during the hospital encounter of 07/19/15  I-STAT, chem 8  Result Value Ref Range   Sodium 139 135 - 145 mmol/L   Potassium 4.4 3.5 - 5.1 mmol/L   Chloride 99 (L) 101 - 111 mmol/L   BUN 17 6 - 20 mg/dL   Creatinine, Ser 1.20 (H) 0.44 - 1.00 mg/dL   Glucose, Bld 111 (H) 65 - 99 mg/dL   Calcium, Ion 1.14 1.13 - 1.30 mmol/L   TCO2 29 0 - 100 mmol/L   Hemoglobin 13.9 12.0 - 15.0 g/dL   HCT 41.0 36.0 - 46.0 %    Imaging Review No results found.   Visual Acuity Review  Right Eye Distance:   Left Eye Distance:   Bilateral Distance:    Right Eye Near:   Left Eye Near:    Bilateral Near:         MDM   1. Bilateral lower extremity edema   2. Essential hypertension   3. Adverse drug effect, initial encounter   4. Slow transit constipation     Peripheral edema is less likely due to cardiacor renal insufficiency this time. Likely due to the amlodipine and possibly associated with undiagnosed peripheral vascular disease. She may use compression stockings. Stop the amlodipine and start  HCTZ 12.5 mg daily. Follow-up with the PCP as soon as possible. For worsening, new symptoms or problems such shortness of breath particularly when lying down chest pain go to the emergency department.  Meds ordered this encounter  Medications  . hydrochlorothiazide (HYDRODIURIL) 12.5 MG tablet    Sig: Take 1 tablet (12.5 mg total) by mouth daily.    Dispense:  30 tablet    Refill:  0    Order Specific Question:  Supervising Provider    Answer:  Carmela Hurt     Janne Napoleon, NP 07/19/15 1420

## 2015-07-21 ENCOUNTER — Ambulatory Visit: Payer: No Typology Code available for payment source | Attending: Family Medicine | Admitting: Family Medicine

## 2015-07-21 ENCOUNTER — Encounter: Payer: Self-pay | Admitting: Family Medicine

## 2015-07-21 VITALS — BP 146/72 | HR 80 | Temp 97.4°F | Resp 16 | Ht 60.0 in | Wt 167.0 lb

## 2015-07-21 DIAGNOSIS — R1032 Left lower quadrant pain: Secondary | ICD-10-CM

## 2015-07-21 DIAGNOSIS — R609 Edema, unspecified: Secondary | ICD-10-CM | POA: Insufficient documentation

## 2015-07-21 DIAGNOSIS — G8929 Other chronic pain: Secondary | ICD-10-CM

## 2015-07-21 DIAGNOSIS — Z0000 Encounter for general adult medical examination without abnormal findings: Secondary | ICD-10-CM

## 2015-07-21 DIAGNOSIS — I872 Venous insufficiency (chronic) (peripheral): Secondary | ICD-10-CM | POA: Insufficient documentation

## 2015-07-21 DIAGNOSIS — Z79899 Other long term (current) drug therapy: Secondary | ICD-10-CM | POA: Insufficient documentation

## 2015-07-21 DIAGNOSIS — R600 Localized edema: Secondary | ICD-10-CM | POA: Insufficient documentation

## 2015-07-21 DIAGNOSIS — Z7982 Long term (current) use of aspirin: Secondary | ICD-10-CM | POA: Insufficient documentation

## 2015-07-21 DIAGNOSIS — R1031 Right lower quadrant pain: Secondary | ICD-10-CM

## 2015-07-21 DIAGNOSIS — I10 Essential (primary) hypertension: Secondary | ICD-10-CM | POA: Insufficient documentation

## 2015-07-21 DIAGNOSIS — R300 Dysuria: Secondary | ICD-10-CM | POA: Insufficient documentation

## 2015-07-21 LAB — POCT URINALYSIS DIPSTICK
BILIRUBIN UA: NEGATIVE
Blood, UA: NEGATIVE
Glucose, UA: NEGATIVE
KETONES UA: NEGATIVE
Nitrite, UA: NEGATIVE
PROTEIN UA: NEGATIVE
Spec Grav, UA: 1.01
Urobilinogen, UA: 0.2
pH, UA: 7

## 2015-07-21 LAB — BASIC METABOLIC PANEL
BUN: 23 mg/dL (ref 7–25)
CO2: 28 mmol/L (ref 20–31)
CREATININE: 1.05 mg/dL — AB (ref 0.60–0.93)
Calcium: 9.1 mg/dL (ref 8.6–10.4)
Chloride: 97 mmol/L — ABNORMAL LOW (ref 98–110)
GLUCOSE: 91 mg/dL (ref 65–99)
POTASSIUM: 4 mmol/L (ref 3.5–5.3)
Sodium: 133 mmol/L — ABNORMAL LOW (ref 135–146)

## 2015-07-21 MED ORDER — MEDICAL COMPRESSION STOCKINGS MISC: Status: DC

## 2015-07-21 MED ORDER — HYDROCHLOROTHIAZIDE 25 MG PO TABS: 25.0000 mg | ORAL_TABLET | Freq: Every day | ORAL | Status: DC

## 2015-07-21 MED ORDER — FUROSEMIDE 40 MG PO TABS: 40.0000 mg | ORAL_TABLET | Freq: Every day | ORAL | Status: DC

## 2015-07-21 MED ORDER — POTASSIUM CHLORIDE CRYS ER 20 MEQ PO TBCR: 40.0000 meq | EXTENDED_RELEASE_TABLET | Freq: Every day | ORAL | Status: DC

## 2015-07-21 MED FILL — POTASSIUM CL ER 20 MEQ TAB: 20 | 5 days supply | Qty: 10 | Fill #0

## 2015-07-21 MED FILL — FUROSEMIDE 40 MG TABLET: 40 | 5 days supply | Qty: 5 | Fill #0

## 2015-07-21 NOTE — Progress Notes (Signed)
Complaining of leg and feet swelling  Pain scale #5  Pain and swelling worsen with standing and walking No tobacco user  No suicidal thoughts in the past two weeks

## 2015-07-21 NOTE — Progress Notes (Signed)
Subjective:  Patient ID: Sabrina Wilcox, female    DOB: 1941-03-21  Age: 75 y.o. MRN: [id number] Spanish interpreter used   CC: Hypertension and Leg Swelling   HPI Raul Broadus presents for    1. CHRONIC HYPERTENSION  Disease Monitoring  Blood pressure range: not checking at home   Chest pain: no   Dyspnea: no   Claudication: no   Medication compliance: yes  Medication Side Effects  Lightheadedness: no   Urinary frequency: no   Edema: yes    Preventitive Healthcare:  Exercise: yes, walking   Diet Pattern: regular meals   Salt Restriction: yes   2. Leg swelling: worsening over past 2 weeks. No bilateral leg swelling with pain that is worse after walking. She was on Norvasc but this was stopped at her last UC visit along with lisinopril. She now takes HCTZ 12.5 mg daily. She has not noticed improvement in swelling following switch to HCTZ. She denies high salt diet. No CP, SOB, cough orthopnea, dizziness or abdominal swelling   Social History  Substance Use Topics  . Smoking status: Never Smoker   . Smokeless tobacco: Not on file  . Alcohol Use: No   Outpatient Prescriptions Prior to Visit  Medication Sig Dispense Refill  . acetaminophen (TYLENOL 8 HOUR) 650 MG CR tablet Take 1 tablet (650 mg total) by mouth every 8 (eight) hours as needed for pain. 60 tablet 2  . aspirin EC 81 MG tablet Take 1 tablet (81 mg total) by mouth daily. 30 tablet 11  . atorvastatin (LIPITOR) 40 MG tablet Take 1 tablet (40 mg total) by mouth daily. 90 tablet 3  . Calcium Citrate 200 MG TABS Take 2 tablets (400 mg total) by mouth daily.  0  . Cholecalciferol (VITAMIN D3) 2000 units TABS Take 2,000 Units by mouth daily. 30 tablet 11  . hydrochlorothiazide (HYDRODIURIL) 12.5 MG tablet Take 1 tablet (12.5 mg total) by mouth daily. 30 tablet 0  . lisinopril (PRINIVIL,ZESTRIL) 20 MG tablet Take 1 tablet (20 mg total) by mouth daily. 30 tablet 5  . OVER THE COUNTER MEDICATION Take 2  tablets by mouth every 8 (eight) hours as needed (for cold). Reported on 06/20/2015    . polyethylene glycol powder (GLYCOLAX/MIRALAX) powder Take 17 g by mouth daily. 3350 g 1   No facility-administered medications prior to visit.    ROS Review of Systems  Constitutional: Negative for fever and chills.  Eyes: Negative for visual disturbance.  Respiratory: Negative for shortness of breath.   Cardiovascular: Positive for leg swelling. Negative for chest pain.  Gastrointestinal: Positive for abdominal pain. Negative for blood in stool.  Genitourinary: Positive for dysuria.  Musculoskeletal: Negative for back pain and arthralgias.  Skin: Negative for rash.  Allergic/Immunologic: Negative for immunocompromised state.  Hematological: Negative for adenopathy. Does not bruise/bleed easily.  Psychiatric/Behavioral: Negative for suicidal ideas and dysphoric mood.    Objective:  BP 146/72 mmHg  Pulse 80  Temp(Src) 97.4 F (36.3 C) (Oral)  Resp 16  Ht 5' (1.524 m)  Wt 167 lb (75.751 kg)  BMI 32.62 kg/m2  SpO2 98%  BP/Weight 07/21/2015 07/19/2015 [id number]  Systolic BP 123456 [id number]  Diastolic BP 72 73 76  Wt. (Lbs) 167 - 166  BMI 32.62 - 32.42   Physical Exam  Constitutional: She is oriented to person, place, and time. She appears well-developed and well-nourished. No distress.  HENT:  Head: Normocephalic and atraumatic.  Cardiovascular: Normal rate, regular rhythm, normal heart  sounds and intact distal pulses.   Pulmonary/Chest: Effort normal and breath sounds normal.  Musculoskeletal: She exhibits edema (1+ b/l peripheral edema ).  Neurological: She is alert and oriented to person, place, and time.  Skin: Skin is warm and dry. No rash noted.  Psychiatric: She has a normal mood and affect.   UA: small LE, otherwise wnl  Assessment & Plan:   There are no diagnoses linked to this encounter. Yaniris was seen today for hypertension.  Diagnoses and all orders for this  visit:  Dysuria -     POCT urinalysis dipstick  Essential hypertension -     hydrochlorothiazide (HYDRODIURIL) 25 MG tablet; Take 1 tablet (25 mg total) by mouth daily.  Chronic venous insufficiency -     Elastic Bandages & Supports (MEDICAL COMPRESSION STOCKINGS) MISC; 30 mm Hg pressure   Meds ordered this encounter  Medications  . hydrochlorothiazide (HYDRODIURIL) 25 MG tablet    Sig: Take 1 tablet (25 mg total) by mouth daily.    Dispense:  30 tablet    Refill:  5  . Elastic Bandages & Supports (MEDICAL COMPRESSION STOCKINGS) MISC    Sig: 30 mm Hg pressure    Dispense:  2 each    Refill:  0  . DISCONTD: furosemide (LASIX) 40 MG tablet    Sig: Take 1 tablet (40 mg total) by mouth daily.    Dispense:  30 tablet    Refill:  3  . DISCONTD: potassium chloride SA (K-DUR,KLOR-CON) 20 MEQ tablet    Sig: Take 2 tablets (40 mEq total) by mouth daily.    Dispense:  30 tablet    Refill:  3  . furosemide (LASIX) 40 MG tablet    Sig: Take 1 tablet (40 mg total) by mouth daily.    Dispense:  30 tablet    Refill:  0  . potassium chloride SA (K-DUR,KLOR-CON) 20 MEQ tablet    Sig: Take 2 tablets (40 mEq total) by mouth daily.    Dispense:  30 tablet    Refill:  0    Follow-up: No Follow-up on file.   Boykin Nearing MD

## 2015-07-21 NOTE — Assessment & Plan Note (Signed)
A: HTN improved from last OV with worsening leg swelling  P: Check BMP and pro BNP  First next 5  days do the following: Hydrochlorothiazide 12.5 daily along with lasix 40 mg daily with potassium  Then on day 6 increase Hydrochlorothiazide to 25 mg daily. No lasix or potassium  F/u in 7-10 days to check BP and leg swelling, make appointment before you leave

## 2015-07-21 NOTE — Assessment & Plan Note (Signed)
A: b/l peripheral edema suspect venous insufficiency P: For next 5  days do the following: Hydrochlorothiazide 12.5 daily along with lasix 40 mg daily with potassium  Then on day 6 increase Hydrochlorothiazide to 25 mg daily. No lasix or potassium  F/u in 7-10 days to check BP and leg swelling, make appointment before you leave

## 2015-07-21 NOTE — Patient Instructions (Addendum)
Sabrina Wilcox was seen today for hypertension.  Diagnoses and all orders for this visit:  Dysuria -     POCT urinalysis dipstick  Essential hypertension -     hydrochlorothiazide (HYDRODIURIL) 25 MG tablet; Take 1 tablet (25 mg total) by mouth daily.  Chronic venous insufficiency -     Elastic Bandages & Supports (MEDICAL COMPRESSION STOCKINGS) MISC; 30 mm Hg pressure  Peripheral edema -     Discontinue: furosemide (LASIX) 40 MG tablet; Take 1 tablet (40 mg total) by mouth daily. -     Discontinue: potassium chloride SA (K-DUR,KLOR-CON) 20 MEQ tablet; Take 2 tablets (40 mEq total) by mouth daily. -     Pro b natriuretic peptide (BNP) -     furosemide (LASIX) 40 MG tablet; Take 1 tablet (40 mg total) by mouth daily. -     potassium chloride SA (K-DUR,KLOR-CON) 20 MEQ tablet; Take 2 tablets (40 mEq total) by mouth daily.  Healthcare maintenance -     Ambulatory referral to Gastroenterology   First next 5  days do Sabrina following: Hydrochlorothiazide 12.5 daily along with lasix 40 mg daily with potassium  Then on day 6 increase Hydrochlorothiazide to 25 mg daily. No lasix or potassium  F/u in 7-10 days to check BP and leg swelling, make appointment before you leave   Dr.  Miguel Wilcox venosa o insuficiencia venosa crnica (Venous Stasis or Chronic Venous Insufficiency) La insuficiencia venosa crnica, tambin llamada estasis venosa, es una enfermedad que afecta las venas de las piernas. Esta afeccin evita el bombeo eficaz de la sangre a travs de las venas. La sangre ya no se bombea correctamente de las piernas al [employer]. La enfermedad puede ser de leve a grave. Con un tratamiento adecuado, podr llevar Sabrina Wilcox. CAUSAS  La insuficiencia venosa crnica ocurre cuando las paredes de las venas se estiran, debilitan o daan, o cuando las vlvulas de las venas estn daadas. Algunas causas comunes incluyen:  Hipertensin arterial en las venas (hipertensin venosa).  Aumento de la  tensin arterial en las venas de las piernas por pasar largos perodos sentado o de pie.  Un cogulo sanguneo que bloquea la circulacin en una vena (trombosis venosa profunda).  Una inflamacin de una vena superficial (flebitis) que forma un cogulo sanguneo . FACTORES DE RIESGO Hay diversos factores que aumentan las probabilidades de desarrollar insuficiencia venosa crnica, por ejemplo:  Antecedentes familiares de la enfermedad.  Obesidad.  Embarazo.  Estilo de vida sedentario.  Fumar.  Trabajos que requieren Equities trader perodos de pie o Print production planner.  Tener [employer]. Las [employer] 40 y 74aos y los hombres de ms de 108 aos tienen una probabilidad mayor de Armed forces training and education officer enfermedad. Sabrina Wilcox SNTOMAS  Los sntomas pueden ser:   Venas varicosas  Laceracin o lceras en la piel.  Enrojecimiento o cambio de color en la piel de la pierna.  Sabrina Wilcox, Sabrina Wilcox y tirante, y con dolor por arriba del tobillo, generalmente sobre la superficie interna (lipodermatosclerosis).  Hinchazn. DIAGNSTICO  Para diagnosticar la enfermedad, el mdico le har una historia clnica y un examen fsico. Para confirmar el diagnstico, le indicarn las siguientes pruebas:  Ecografa dplex: procedimiento que produce una imagen de los vasos sanguneos y los rganos cercanos, y adems proporciona informacin sobre el flujo sanguneo a travs de los vasos.  Pletismografa: procedimiento que estudia el flujo sanguneo.  Venograma o venografa: procedimiento utilizado para observar las venas mediante una radiografa y Sabrina Wilcox sustancia de  contraste. TRATAMIENTO Los [employer] del tratamiento es que la persona vuelva a tener una vida activa y minimizar el dolor o la discapacidad. El tratamiento depender de la gravedad de la enfermedad. Los procedimientos mdicos pueden necesitarse para casos graves. Las opciones de tratamiento son:   Sabrina Wilcox de medias de compresin. Ayudan a  Sabrina Wilcox y a [employer] probabilidades de que el problema [place], pero no lo curan.  Escleroterapia, un procedimiento que implica la aplicacin de una inyeccin con una sustancia que "disuelve" las venas daadas. Otras venas toman la funcin de las venas daadas.  Ciruga para extraer la vena o cortar el flujo sanguneo a travs de la vena (extirpacin de la vena o ciruga de ablacin con lser).  Ciruga para reparar una vlvula. INSTRUCCIONES PARA EL CUIDADO EN EL HOGAR   Use medias de compresin como le haya indicado su mdico.  Utilice los medicamentos de venta libre o recetados para Glass blower/designer, el malestar o la fiebre, segn se lo indique el mdico.  Concurra a las consultas de control con su mdico segn las indicaciones. SOLICITE ATENCIN MDICA SI:   Tiene enrojecimiento, hinchazn o aumento del dolor en la zona afectada.  Observa una lnea roja que se extiende por arriba o por debajo de la zona afectada.  Tiene una laceracin o prdida de la piel en la zona afectada, aunque sea pequea.  Se lesiona la zona afectada. SOLICITE ATENCIN MDICA DE INMEDIATO SI:   Tiene una lesin y Sabrina Wilcox herida abierta en la zona afectada.  El dolor es intenso y no mejora con los medicamentos.  Tiene adormecimiento o debilidad de repente en el pie o el tobillo por debajo de la zona afectada, o tiene dificultad para mover el pie o el tobillo.  Tiene fiebre o sntomas persistentes durante ms de 2a 3das.  Tiene fiebre y los sntomas empeoran repentinamente. ASEGRESE DE QUE:   Comprende estas instrucciones.  Controlar su afeccin.  Recibir ayuda de inmediato si no mejora o si empeora.   Esta informacin no tiene Marine scientist el consejo del mdico. Asegrese de hacerle al mdico cualquier pregunta que tenga.   Document Released: 07/04/2008 Document Revised: 01/06/2013 Elsevier Interactive Patient Education [employer].

## 2015-07-22 LAB — PRO B NATRIURETIC PEPTIDE: Pro B Natriuretic peptide (BNP): 95.22 pg/mL (ref <126)

## 2015-07-24 ENCOUNTER — Encounter: Payer: Self-pay | Admitting: Gastroenterology

## 2015-07-31 ENCOUNTER — Encounter: Payer: Self-pay | Admitting: Family Medicine

## 2015-07-31 ENCOUNTER — Ambulatory Visit: Payer: No Typology Code available for payment source | Attending: Family Medicine | Admitting: Family Medicine

## 2015-07-31 VITALS — BP 124/67 | HR 70 | Temp 98.0°F | Resp 16 | Ht 60.0 in | Wt 167.0 lb

## 2015-07-31 DIAGNOSIS — I872 Venous insufficiency (chronic) (peripheral): Secondary | ICD-10-CM | POA: Insufficient documentation

## 2015-07-31 DIAGNOSIS — R609 Edema, unspecified: Secondary | ICD-10-CM

## 2015-07-31 DIAGNOSIS — Z7982 Long term (current) use of aspirin: Secondary | ICD-10-CM | POA: Insufficient documentation

## 2015-07-31 DIAGNOSIS — M7989 Other specified soft tissue disorders: Secondary | ICD-10-CM | POA: Insufficient documentation

## 2015-07-31 DIAGNOSIS — Z79899 Other long term (current) drug therapy: Secondary | ICD-10-CM | POA: Insufficient documentation

## 2015-07-31 DIAGNOSIS — R600 Localized edema: Secondary | ICD-10-CM | POA: Insufficient documentation

## 2015-07-31 DIAGNOSIS — I10 Essential (primary) hypertension: Secondary | ICD-10-CM

## 2015-07-31 MED ORDER — MEDICAL COMPRESSION STOCKINGS MISC
Status: AC
Start: 2015-07-31 — End: ?

## 2015-07-31 MED FILL — ?HYDROCHLOROTHIAZIDE 25 MG: 25 MG | 30 days supply | Qty: 30 | Fill #0

## 2015-07-31 NOTE — Assessment & Plan Note (Signed)
A: HTN BP at goal, on HCTZ 12.5 mg daily Med: compliant P: Increase HCTZ to 25 mg daily due to edema Check BMP and GFR

## 2015-07-31 NOTE — Patient Instructions (Addendum)
Sabrina Wilcox was seen today for hypertension and leg swelling.  Diagnoses and all orders for this visit:  Chronic venous insufficiency -     Ambulatory referral to Vascular Surgery -     LE VENOUS; Future  Peripheral edema -     Ambulatory referral to Vascular Surgery -     LE VENOUS; Future  Essential hypertension -     BASIC METABOLIC PANEL WITH GFR    In anticipation of seeing the vascular and vein specialist I have ordered ultrasound of your legs to evaluate the veins and blood flow  Call if swelling worsens after walking for a repeat course of lasix with potassium Wear compression stocking starting first thing the the morning to prevent swelling   F/u in 6 weeks for venous insufficiency   Dr. Miguel Dibble venosa o insuficiencia venosa crnica (Venous Stasis or Chronic Venous Insufficiency) La insuficiencia venosa crnica, tambin llamada estasis venosa, es una enfermedad que afecta las venas de las piernas. Esta afeccin evita el bombeo eficaz de la sangre a travs de las venas. La sangre ya no se bombea correctamente de las piernas al [employer]. La enfermedad puede ser de leve a grave. Con un tratamiento adecuado, podr llevar Sabrina Wilcox. CAUSAS  La insuficiencia venosa crnica ocurre cuando las paredes de las venas se estiran, debilitan o daan, o cuando las vlvulas de las venas estn daadas. Algunas causas comunes incluyen:  Hipertensin arterial en las venas (hipertensin venosa).  Aumento de la tensin arterial en las venas de las piernas por pasar largos perodos sentado o de pie.  Un cogulo sanguneo que bloquea la circulacin en una vena (trombosis venosa profunda).  Una inflamacin de una vena superficial (flebitis) que forma un cogulo sanguneo . FACTORES DE RIESGO Hay diversos factores que aumentan las probabilidades de desarrollar insuficiencia venosa crnica, por ejemplo:  Antecedentes familiares de la enfermedad.  Obesidad.  Embarazo.  Estilo  de vida sedentario.  Fumar.  Trabajos que requieren Equities trader perodos de pie o Print production planner.  Tener [employer]. Las [employer] 40 y 76aos y los hombres de ms de 76 aos tienen una probabilidad mayor de Armed forces training and education officer enfermedad. Lahoma Rocker SNTOMAS  Los sntomas pueden ser:   Venas varicosas  Laceracin o lceras en la piel.  Enrojecimiento o cambio de color en la piel de la pierna.  Ashby Dawes, Bea Laura y tirante, y con dolor por arriba del tobillo, generalmente sobre la superficie interna (lipodermatosclerosis).  Hinchazn. DIAGNSTICO  Para diagnosticar la enfermedad, el mdico le har una historia clnica y un examen fsico. Para confirmar el diagnstico, le indicarn las siguientes pruebas:  Ecografa dplex: procedimiento que produce una imagen de los vasos sanguneos y los rganos cercanos, y adems proporciona informacin sobre el flujo sanguneo a travs de los vasos.  Pletismografa: procedimiento que estudia el flujo sanguneo.  Venograma o venografa: procedimiento utilizado para observar las venas mediante una radiografa y Ardelia Mems sustancia de [place]. TRATAMIENTO Los [employer] del tratamiento es que la persona vuelva a tener una vida activa y minimizar el dolor o la discapacidad. El tratamiento depender de la gravedad de la enfermedad. Los procedimientos mdicos pueden necesitarse para casos graves. Las opciones de tratamiento son:   Roxy Horseman de medias de compresin. Ayudan a [employer] y a [employer] probabilidades de que el problema [place], pero no lo curan.  Escleroterapia, un procedimiento que implica la aplicacin de una inyeccin con una sustancia que "disuelve" las venas daadas. Otras venas  toman la funcin de las venas daadas.  Ciruga para extraer la vena o cortar el flujo sanguneo a travs de la vena (extirpacin de la vena o ciruga de ablacin con lser).  Ciruga para reparar una vlvula. INSTRUCCIONES PARA EL  CUIDADO EN EL HOGAR   Use medias de compresin como le haya indicado su mdico.  Utilice los medicamentos de venta libre o recetados para Glass blower/designer, el malestar o la fiebre, segn se lo indique el mdico.  Concurra a las consultas de control con su mdico segn las indicaciones. SOLICITE ATENCIN MDICA SI:   Tiene enrojecimiento, hinchazn o aumento del dolor en la zona afectada.  Observa una lnea roja que se extiende por arriba o por debajo de la zona afectada.  Tiene una laceracin o prdida de la piel en la zona afectada, aunque sea pequea.  Se lesiona la zona afectada. SOLICITE ATENCIN MDICA DE INMEDIATO SI:   Tiene una lesin y Ardelia Mems herida abierta en la zona afectada.  El dolor es intenso y no mejora con los medicamentos.  Tiene adormecimiento o debilidad de repente en el pie o el tobillo por debajo de la zona afectada, o tiene dificultad para mover el pie o el tobillo.  Tiene fiebre o sntomas persistentes durante ms de 2a 3das.  Tiene fiebre y los sntomas empeoran repentinamente. ASEGRESE DE QUE:   Comprende estas instrucciones.  Controlar su afeccin.  Recibir ayuda de inmediato si no mejora o si empeora.   Esta informacin no tiene Marine scientist el consejo del mdico. Asegrese de hacerle al mdico cualquier pregunta que tenga.   Document Released: 07/04/2008 Document Revised: 01/06/2013 Elsevier Interactive Patient Education [employer].

## 2015-07-31 NOTE — Progress Notes (Signed)
Subjective:  Patient ID: Sabrina Wilcox, female    DOB: Nov 09, 1940  Age: 75 y.o. MRN: [id number]  Spanish interpreter used   CC: Hypertension and Leg Swelling   HPI Sabrina Wilcox has hx and hx of stroke, she present with her daughter for    1. HTN: taking HCTZ 12.5 mg daily. No HA, CP, SOB, cough. LE edema has improved.   2. LE edema: improved. She took 5 days of lasix with potassium and stopped daily walks. Has not started wearing compression stockings. Minimal leg pain. We reviewed labs from last visit with normal BNP, slightly low Na and Chloride, improved Cr.   Social History  Substance Use Topics  . Smoking status: Never Smoker   . Smokeless tobacco: Not on file  . Alcohol Use: No    Outpatient Prescriptions Prior to Visit  Medication Sig Dispense Refill  . acetaminophen (TYLENOL 8 HOUR) 650 MG CR tablet Take 1 tablet (650 mg total) by mouth every 8 (eight) hours as needed for pain. 60 tablet 2  . aspirin EC 81 MG tablet Take 1 tablet (81 mg total) by mouth daily. 30 tablet 11  . atorvastatin (LIPITOR) 40 MG tablet Take 1 tablet (40 mg total) by mouth daily. 90 tablet 3  . Calcium Citrate 200 MG TABS Take 2 tablets (400 mg total) by mouth daily.  0  . Cholecalciferol (VITAMIN D3) 2000 units TABS Take 2,000 Units by mouth daily. 30 tablet 11  . Elastic Bandages & Supports (MEDICAL COMPRESSION STOCKINGS) MISC 30 mm Hg pressure 2 each 0  . furosemide (LASIX) 40 MG tablet Take 1 tablet (40 mg total) by mouth daily. For next 5 days only 5 tablet 0  . hydrochlorothiazide (HYDRODIURIL) 25 MG tablet Take 1 tablet (25 mg total) by mouth daily. 30 tablet 2  . OVER THE COUNTER MEDICATION Take 2 tablets by mouth every 8 (eight) hours as needed (for cold). Reported on 06/20/2015    . polyethylene glycol powder (GLYCOLAX/MIRALAX) powder Take 17 g by mouth daily. 3350 g 1  . potassium chloride SA (K-DUR,KLOR-CON) 20 MEQ tablet Take 2 tablets (40 mEq total) by mouth daily. 10  tablet 0   No facility-administered medications prior to visit.    ROS Review of Systems  Constitutional: Negative for fever and chills.  Eyes: Negative for visual disturbance.  Respiratory: Negative for shortness of breath.   Cardiovascular: Positive for leg swelling. Negative for chest pain.  Gastrointestinal: Negative for blood in stool.  Musculoskeletal: Negative for back pain and arthralgias.  Skin: Negative for rash.  Allergic/Immunologic: Negative for immunocompromised state.  Hematological: Negative for adenopathy. Does not bruise/bleed easily.  Psychiatric/Behavioral: Negative for suicidal ideas and dysphoric mood.    Objective:  BP 124/67 mmHg  Pulse 70  Temp(Src) 98 F (36.7 C) (Oral)  Resp 16  Ht 5' (1.524 m)  Wt 167 lb (75.751 kg)  BMI 32.62 kg/m2  SpO2 98%  BP/Weight 07/31/2015 07/21/2015 [id number]  Systolic BP [id number]  Diastolic BP 67 72 73  Wt. (Lbs) 167 167 -  BMI 32.62 32.62 -   Physical Exam  Constitutional: She is oriented to person, place, and time. She appears well-developed and well-nourished. No distress.  HENT:  Head: Normocephalic and atraumatic.  Cardiovascular: Normal rate, regular rhythm, normal heart sounds and intact distal pulses.   Pulmonary/Chest: Effort normal and breath sounds normal.  Musculoskeletal: She exhibits edema (trace b/l LE edema from feet to knees ).  Neurological: She is  alert and oriented to person, place, and time.  Skin: Skin is warm and dry. No rash noted.  Psychiatric: She has a normal mood and affect.    Assessment & Plan:   There are no diagnoses linked to this encounter. Jenniyah was seen today for hypertension and leg swelling.  Diagnoses and all orders for this visit:  Chronic venous insufficiency -     Ambulatory referral to Vascular Surgery -     LE VENOUS; Future -     Elastic Bandages & Supports ([place]) MISC; 30 mm Hg pressure  Peripheral edema -     Ambulatory referral to  Vascular Surgery -     LE VENOUS; Future  Essential hypertension -     BASIC METABOLIC PANEL WITH GFR   Meds ordered this encounter  Medications  . Elastic Bandages & Supports (MEDICAL COMPRESSION STOCKINGS) MISC    Sig: 30 mm Hg pressure    Dispense:  2 each    Refill:  0    Follow-up: No Follow-up on file.   Boykin Nearing MD

## 2015-07-31 NOTE — Progress Notes (Signed)
F/U leg swelling, HTN  Stated swelling came down, not walking as much  No tobacco user  No suicidal thoughts in the past two weeks

## 2015-07-31 NOTE — Assessment & Plan Note (Signed)
Improved edema following lasix and decreasing walking  Re-ordered compression stocks LE venous dopplers b/l Referral to vein specialist

## 2015-08-01 ENCOUNTER — Telehealth: Payer: Self-pay | Admitting: *Deleted

## 2015-08-01 LAB — BASIC METABOLIC PANEL WITH GFR
BUN: 13 mg/dL (ref 7–25)
CALCIUM: 9.1 mg/dL (ref 8.6–10.4)
CO2: 30 mmol/L (ref 20–31)
Chloride: 101 mmol/L (ref 98–110)
Creat: 0.73 mg/dL (ref 0.60–0.93)
GFR, EST NON AFRICAN AMERICAN: 81 mL/min (ref >=60)
GFR, Est African American: >89 mL/min (ref >=60)
GLUCOSE: 82 mg/dL (ref 65–99)
Potassium: 4.6 mmol/L (ref 3.5–5.3)
Sodium: 142 mmol/L (ref 135–146)

## 2015-08-01 NOTE — Telephone Encounter (Signed)
-----   Message from Boykin Nearing, MD sent at 07/25/2015  9:15 AM EDT ----- Pro BNP normal no evidence of heart failure Low Na slight and low Chloride slight Continue care plan

## 2015-08-01 NOTE — Telephone Encounter (Signed)
-----   Message from Boykin Nearing, MD sent at 08/01/2015  8:26 AM EDT ----- Normal BMP that has improved with diuresis with lasix  Continue current care plan

## 2015-08-03 ENCOUNTER — Ambulatory Visit (HOSPITAL_COMMUNITY)
Admission: RE | Admit: 2015-08-03 | Discharge: 2015-08-03 | Disposition: A | Discharge status: Home / Self Care | Payer: No Typology Code available for payment source | Source: Ambulatory Visit | Attending: Family Medicine | Admitting: Family Medicine

## 2015-08-03 DIAGNOSIS — R609 Edema, unspecified: Secondary | ICD-10-CM | POA: Insufficient documentation

## 2015-08-03 DIAGNOSIS — I10 Essential (primary) hypertension: Secondary | ICD-10-CM | POA: Insufficient documentation

## 2015-08-03 DIAGNOSIS — I872 Venous insufficiency (chronic) (peripheral): Secondary | ICD-10-CM | POA: Insufficient documentation

## 2015-08-03 NOTE — Telephone Encounter (Signed)
LVM with female to return call 

## 2015-08-03 NOTE — Progress Notes (Signed)
VASCULAR LAB PRELIMINARY  PRELIMINARY  PRELIMINARY  PRELIMINARY  Bilateral lower extremity venous duplex completed.    Preliminary report:  Bilateral:  No evidence of DVT, superficial thrombosis, or Baker's Cyst.   Trinadee Verhagen, RVS 08/03/2015, 11:11 AM

## 2015-08-04 NOTE — Telephone Encounter (Signed)
-----   Message from Boykin Nearing, MD sent at 08/01/2015  8:26 AM EDT ----- Normal BMP that has improved with diuresis with lasix  Continue current care plan

## 2015-08-04 NOTE — Telephone Encounter (Signed)
LVM with female to return call 

## 2015-08-07 ENCOUNTER — Other Ambulatory Visit: Payer: Self-pay | Admitting: *Deleted

## 2015-08-07 DIAGNOSIS — I872 Venous insufficiency (chronic) (peripheral): Secondary | ICD-10-CM

## 2015-08-08 ENCOUNTER — Telehealth: Payer: Self-pay

## 2015-08-08 NOTE — Telephone Encounter (Signed)
Dr. Havery Moros, This pt is scheduled for a direct colon with you on 08/31/15.She is a 75 year old that had her last colon over 10 years ago in [place].Does she need an OV  or is a direct colon OK.Please advise,Thanks,Janis

## 2015-08-09 NOTE — Telephone Encounter (Signed)
Will proceed as scheduled and will evaluate at [facility]

## 2015-08-09 NOTE — Telephone Encounter (Signed)
Looking through her medical history, no significant cormorbidities noted, I think it is okay to directly book her for colonoscopy if she wishes to have optical colonoscopy.

## 2015-08-14 ENCOUNTER — Ambulatory Visit (AMBULATORY_SURGERY_CENTER): Payer: Self-pay | Admitting: *Deleted

## 2015-08-14 VITALS — Ht 60.0 in | Wt 166.0 lb

## 2015-08-14 DIAGNOSIS — Z1211 Encounter for screening for malignant neoplasm of colon: Secondary | ICD-10-CM

## 2015-08-14 MED ORDER — NA SULFATE-K SULFATE-MG SULF 17.5-3.13-1.6 GM/177ML PO SOLN: 1.0000 | Freq: Once | ORAL | Status: DC

## 2015-08-14 NOTE — Progress Notes (Signed)
Pt states she has constipation, she uses miralax as needed- she states she uses this daily to as needed- she states her stools are soft but she has a hard time passing the stool.  She denies having hard stool - she has to sit on the toilet a long time trying to pass the stool. When she eats she has cramping in the abdomen , she denies rectal bleeding. -she also is complaining of anal/ rectal pain when she tries to stool and this has been going on for many many years  No egg or soy allergy known to patient  No issues with past sedation with any surgeries  or procedures, no intubation problems - she states in [place] after surgery and colonoscopies she was seeing things and did not feel like herself but no N/V or other post op issues  No diet pills per patient No home 02 use per patient  No blood thinners per patient  Pt states  issues with constipation - see above  TE to Julieanne Cotton CMA for her to call pt. When sample is here to be picked up for pt as her insurance is the [facility] discount Daughter and interpreter in [place] today with patient . All questions answered with both and patient  Instructed pt to take miralax daily for 5 days before the procedure even though she states her stools are soft but hard for her to pass them

## 2015-08-22 ENCOUNTER — Telehealth: Payer: Self-pay | Admitting: Gastroenterology

## 2015-08-23 NOTE — Telephone Encounter (Signed)
Patient does not speak English so I went ahead and put a sample of front for her.  Sabrina Wilcox is going to call her to come pick it up.

## 2015-08-31 ENCOUNTER — Encounter: Payer: Self-pay | Admitting: Gastroenterology

## 2015-08-31 ENCOUNTER — Ambulatory Visit (AMBULATORY_SURGERY_CENTER): Payer: Self-pay | Admitting: Gastroenterology

## 2015-08-31 VITALS — BP 167/68 | HR 53 | Temp 97.3°F | Resp 15 | Ht 60.0 in | Wt 166.0 lb

## 2015-08-31 DIAGNOSIS — Z1211 Encounter for screening for malignant neoplasm of colon: Secondary | ICD-10-CM

## 2015-08-31 DIAGNOSIS — D122 Benign neoplasm of ascending colon: Secondary | ICD-10-CM

## 2015-08-31 DIAGNOSIS — D120 Benign neoplasm of cecum: Secondary | ICD-10-CM

## 2015-08-31 DIAGNOSIS — D123 Benign neoplasm of transverse colon: Secondary | ICD-10-CM

## 2015-08-31 DIAGNOSIS — D125 Benign neoplasm of sigmoid colon: Secondary | ICD-10-CM

## 2015-08-31 MED ORDER — SODIUM CHLORIDE 0.9 % IV SOLN: 500.0000 mL | INTRAVENOUS | Status: DC

## 2015-08-31 NOTE — Op Note (Signed)
[place] Patient Name: Sabrina Wilcox Procedure Date: 08/31/2015 11:39 AM MRN: [id number] Endoscopist: Remo Lipps P. Havery Moros , MD Age: 75 Referring MD:  Date of Birth: [birth year] Gender: Female Procedure:                Colonoscopy Indications:              Screening for malignant neoplasm in the colon Medicines:                Monitored Anesthesia Care Procedure:                Pre-Anesthesia Assessment:                           - Prior to the procedure, a History and Physical                            was performed, and patient medications and                            allergies were reviewed. The patient's tolerance of                            previous anesthesia was also reviewed. The risks                            and benefits of the procedure and the sedation                            options and risks were discussed with the patient.                            All questions were answered, and informed consent                            was obtained. Prior Anticoagulants: The patient has                            taken aspirin, last dose was 1 day prior to                            procedure. ASA Grade Assessment: III - A patient                            with severe systemic disease. After reviewing the                            risks and benefits, the patient was deemed in                            satisfactory condition to undergo the procedure.                           After obtaining informed consent, the colonoscope  was passed under direct vision. Throughout the                            procedure, the patient's blood pressure, pulse, and                            oxygen saturations were monitored continuously. The                            Model CF-HQ190L [id number]) scope was introduced                            through the anus and advanced to the the cecum,                            identified by appendiceal  orifice and ileocecal                            valve. The colonoscopy was performed without                            difficulty. The patient tolerated the procedure                            well. The quality of the bowel preparation was                            adequate. The ileocecal valve, appendiceal orifice,                            and rectum were photographed. Scope In: 11:47:32 AM Scope Out: 12:12:40 PM Scope Withdrawal Time: 0 hours 16 minutes 36 seconds  Total Procedure Duration: 0 hours 25 minutes 8 seconds  Findings:                 The perianal exam findings include abnormal                            perianal exam, ? history of perianal or vaginal                            surgery.                           A 3 mm polyp was found in the cecum. The polyp was                            sessile. The polyp was removed with a cold snare.                            Resection and retrieval were complete.                           A 3 mm polyp was found in the ascending colon. The  polyp was sessile. The polyp was removed with a                            cold snare. Resection and retrieval were complete.                           Two sessile polyps were found in the transverse                            colon. The polyps were 3 to 5 mm in size. These                            polyps were removed with a cold snare. Resection                            and retrieval were complete.                           A 4 mm polyp was found in the sigmoid colon. The                            polyp was sessile. The polyp was removed with a                            cold snare. Resection and retrieval were complete.                           A large amount of liquid stool was found in the                            ascending colon and in the cecum, making                            visualization difficult. Several minutes were taken                             to lavage of the area, resulting in clearance with                            adequate visualization.                           Non-bleeding internal hemorrhoids were found. The                            hemorrhoids were small.                           The exam was otherwise without abnormality. The                            rectal vault was small and not well visualized on  retroflexion Complications:            No immediate complications. Estimated blood loss:                            Minimal. Estimated Blood Loss:     Estimated blood loss was minimal. Impression:               - Abnormal perianal exam, ? history of perianal or                            vaginal surgery found on perianal exam.                           - One 3 mm polyp in the cecum, removed with a cold                            snare. Resected and retrieved.                           - One 3 mm polyp in the ascending colon, removed                            with a cold snare. Resected and retrieved.                           - Two 3 to 5 mm polyps in the transverse colon,                            removed with a cold snare. Resected and retrieved.                           - One 4 mm polyp in the sigmoid colon, removed with                            a cold snare. Resected and retrieved.                           - Stool in the ascending colon and in the cecum,                            lavaged with adequate visualization.                           - Non-bleeding internal hemorrhoids.                           - The examination was otherwise normal. Recommendation:           - Patient has a contact number available for                            emergencies. The signs and symptoms of potential  delayed complications were discussed with the                            patient. Return to normal activities tomorrow.                            Written discharge  instructions were provided to the                            patient.                           - Resume previous diet.                           - Continue present medications.                           - No ibuprofen, naproxen, or other non-steroidal                            anti-inflammatory drugs for 2 weeks after polyp                            removal.                           - Await pathology results.                           - Repeat colonoscopy is recommended for                            surveillance. The colonoscopy date will be                            determined after pathology results from today's                            exam become available for review. Remo Lipps P. Armbruster, MD 08/31/2015 12:19:36 PM This report has been signed electronically.

## 2015-08-31 NOTE — Progress Notes (Signed)
Called to room to assist during endoscopic procedure.  Patient ID and intended procedure confirmed with present staff. Received instructions for my participation in the procedure from the performing physician.  

## 2015-08-31 NOTE — Patient Instructions (Addendum)
YOU HAD AN ENDOSCOPIC PROCEDURE TODAY AT [facility]:   Refer to the procedure report that was given to you for any specific questions about what was found during the examination.  If the procedure report does not answer your questions, please call your gastroenterologist to clarify.  If you requested that your care partner not be given the details of your procedure findings, then the procedure report has been included in a sealed envelope for you to review at your convenience later.  YOU SHOULD EXPECT: Some feelings of bloating in the abdomen. Passage of more gas than usual.  Walking can help get rid of the air that was put into your GI tract during the procedure and reduce the bloating. If you had a lower endoscopy (such as a colonoscopy or flexible sigmoidoscopy) you may notice spotting of blood in your stool or on the toilet paper. If you underwent a bowel prep for your procedure, you may not have a normal bowel movement for a few days.  Please Note:  You might notice some irritation and congestion in your nose or some drainage.  This is from the oxygen used during your procedure.  There is no need for concern and it should clear up in a day or so.  SYMPTOMS TO REPORT IMMEDIATELY:   Following lower endoscopy (colonoscopy or flexible sigmoidoscopy):  Excessive amounts of blood in the stool  Significant tenderness or worsening of abdominal pains  Swelling of the abdomen that is new, acute  Fever of 100F or higher   For urgent or emergent issues, a gastroenterologist can be reached at any hour by calling [phone].   DIET: Your first meal following the procedure should be a small meal and then it is ok to progress to your normal diet. Heavy or fried foods are harder to digest and may make you feel nauseous or bloated.  Likewise, meals heavy in dairy and vegetables can increase bloating.  Drink plenty of fluids but you should avoid alcoholic beverages for 24  hours.  ACTIVITY:  You should plan to take it easy for the rest of today and you should NOT DRIVE or use heavy machinery until tomorrow (because of the sedation medicines used during the test).    FOLLOW UP: Our staff will call the number listed on your records the next business day following your procedure to check on you and address any questions or concerns that you may have regarding the information given to you following your procedure. If we do not reach you, we will leave a message.  However, if you are feeling well and you are not experiencing any problems, there is no need to return our call.  We will assume that you have returned to your regular daily activities without incident.  If any biopsies were taken you will be contacted by phone or by letter within the next 1-3 weeks.  Please call us at [phone] if you have not heard about the biopsies in 3 weeks.    SIGNATURES/CONFIDENTIALITY: You and/or your care partner have signed paperwork which will be entered into your electronic medical record.  These signatures attest to the fact that that the information above on your After Visit Summary has been reviewed and is understood.  Full responsibility of the confidentiality of this discharge information lies with you and/or your care-partner.    No aspirin, ibuprofen, naproxen, aleve, or other non-steroidal anti-inflammatory drugs for 2 weeks after polyp removal.    Colonoscopa: cuidados posteriores  (  Colonoscopy, Care After) Siga estas instrucciones durante las prximas semanas. Estas indicaciones le proporcionan informacin general acerca de cmo deber cuidarse despus del procedimiento. El mdico tambin podr darle instrucciones ms especficas. El tratamiento ha sido planificado segn las prcticas mdicas actuales, pero en algunos casos pueden ocurrir problemas. Comunquese con el mdico si tiene algn problema o tiene dudas despus del procedimiento. QU ESPERAR DESPUS DEL  PROCEDIMIENTO  Despus del procedimiento, es comn tener las siguientes sensaciones:  Una pequea cantidad de sangre en la materia fecal.  Cantidades moderadas de gases e hinchazn o calambres abdominales leves. INSTRUCCIONES PARA EL CUIDADO EN EL HOGAR  No conduzca vehculos, opere maquinarias ni firme documentos importantes durante 24horas.  Puede ducharse y retomar sus actividades fsicas habituales, pero muvase a un ritmo ms lento durante las primeras 24horas.  Tmese descansos frecuentes durante las primeras 24horas.  Camine o colquese compresas calientes en el abdomen para ayudar a reducir los calambres e hinchazn abdominales.  Beba suficiente lquido para Consulting civil engineer orina clara o de color amarillo plido.  Puede retomar su dieta normal segn las instrucciones de su mdico. Evite los alimentos pesados o fritos que son difciles de [employer].  Evite consumir alcohol durante 24horas o segn las instrucciones de su mdico.  Tome solo medicamentos de venta libre o recetados, segn las indicaciones del mdico.  Si se obtuvo una muestra de tejido (biopsia) durante el procedimiento:  No tome aspirina ni anticoagulantes durante 7das, o segn las instrucciones de su mdico.  No consuma alcohol durante 7das o segn las instrucciones de su mdico.  Consuma alimentos livianos durante las primeras 24horas. SOLICITE ATENCIN MDICA SI: Tiene manchas persistentes de sangre en la materia fecal entre 2 y 3das posteriores al procedimiento. SOLICITE ATENCIN MDICA DE INMEDIATO SI:  Tiene ms que una pequea mancha de sangre en la materia fecal.  Elimina grandes cogulos de sangre en la materia fecal.  Tiene el abdomen hinchado (distendido).  Tiene nuseas o vmitos.  Tiene fiebre.  Siente dolor intenso en el abdomen que no se alivia con los [employer].   Esta informacin no tiene Marine scientist el consejo del mdico. Asegrese de hacerle al mdico cualquier  pregunta que tenga.   Document Released: 06/14/2008 Document Revised: 01/06/2013 Elsevier Interactive Patient Education [employer].

## 2015-08-31 NOTE — Progress Notes (Signed)
Patient came in with 32 oz Gatorade and 3/4 of the bottle was empty.  I asked the patient through the interpreter if she has drank any of this within the 3 hours.  Originally the patient said yes since 4:30 this am.  We then called the daughter back and asked her through the interpreter and the daughter spoke with patient and stated that it was only a sip.  I discussed this with Dr. Havery Moros and Merrily Pew Monday, CRNA and they both agreed to delay her procedure 3 hours.  I then gave the option to patient through the interpreter to wait 3 hours or reschedule the procedure.  They chose to wait.  They wanted to leave and come back and I told them to be back at 10:15.  They agreed to do so.  I also stressed the importance for patient not to have anything by mouth until procedure is done.  They agreed and seemed to understand.

## 2015-08-31 NOTE — Progress Notes (Signed)
Report to PACU, RN, vss, BBS= Clear.  

## 2015-09-01 ENCOUNTER — Telehealth: Payer: Self-pay | Admitting: *Deleted

## 2015-09-01 MED FILL — ?HYDROCHLOROTHIAZIDE 25 MG: 25 MG | 30 days supply | Qty: 30 | Fill #1

## 2015-09-01 MED FILL — ATORVASTATIN 40 MG TABLET: 40 | 30 days supply | Qty: 30 | Fill #1

## 2015-09-01 NOTE — Telephone Encounter (Signed)
  Follow up Call-  Call back number 08/31/2015  Post procedure Call Back phone  # [phone]  Permission to leave phone message Yes   spoke with granddaughter, Hassan Rowan, who interpreted for pt  Patient questions:  Do you have a fever, pain , or abdominal swelling? No. Pain Score  0 *  Have you tolerated food without any problems? Yes.    Have you been able to return to your normal activities? Yes.    Do you have any questions about your discharge instructions: Diet   No. Medications  No. Follow up visit  No.  Do you have questions or concerns about your Care? No.  Actions: * If pain score is 4 or above: No action needed, pain <4.

## 2015-09-07 ENCOUNTER — Encounter: Payer: Self-pay | Admitting: Gastroenterology

## 2015-09-11 ENCOUNTER — Telehealth: Payer: Self-pay | Admitting: Gastroenterology

## 2015-09-11 NOTE — Telephone Encounter (Signed)
Patient's daughter states patient is having stomach pain due to constipation. Per patient's daughter she is not taking Miralax because it did not work right away. Instructed patient to take 1-3 doses of miralax as needed for constipation.

## 2015-09-25 ENCOUNTER — Ambulatory Visit: Payer: No Typology Code available for payment source | Attending: Family Medicine

## 2015-09-25 MED FILL — ATORVASTATIN 40 MG TABLET: 40 | 30 days supply | Qty: 30 | Fill #2

## 2015-10-26 MED FILL — ?ATORVASTATIN 40MG TABLET: 40 | 30 days supply | Qty: 30 | Fill #3

## 2015-10-27 ENCOUNTER — Encounter: Payer: Self-pay | Admitting: Vascular Surgery

## 2015-11-02 ENCOUNTER — Ambulatory Visit (INDEPENDENT_AMBULATORY_CARE_PROVIDER_SITE_OTHER): Payer: No Typology Code available for payment source | Admitting: Vascular Surgery

## 2015-11-02 ENCOUNTER — Encounter: Payer: Self-pay | Admitting: Vascular Surgery

## 2015-11-02 ENCOUNTER — Ambulatory Visit (HOSPITAL_COMMUNITY)
Admission: RE | Admit: 2015-11-02 | Discharge: 2015-11-02 | Disposition: A | Discharge status: Home / Self Care | Payer: No Typology Code available for payment source | Source: Ambulatory Visit | Attending: Vascular Surgery | Admitting: Vascular Surgery

## 2015-11-02 VITALS — BP 190/80 | HR 62 | Temp 97.2°F | Resp 16 | Ht 60.0 in | Wt 172.0 lb

## 2015-11-02 DIAGNOSIS — F329 Major depressive disorder, single episode, unspecified: Secondary | ICD-10-CM | POA: Insufficient documentation

## 2015-11-02 DIAGNOSIS — I8393 Asymptomatic varicose veins of bilateral lower extremities: Secondary | ICD-10-CM | POA: Insufficient documentation

## 2015-11-02 DIAGNOSIS — I10 Essential (primary) hypertension: Secondary | ICD-10-CM | POA: Insufficient documentation

## 2015-11-02 DIAGNOSIS — I872 Venous insufficiency (chronic) (peripheral): Secondary | ICD-10-CM

## 2015-11-02 DIAGNOSIS — E785 Hyperlipidemia, unspecified: Secondary | ICD-10-CM | POA: Insufficient documentation

## 2015-11-02 NOTE — Progress Notes (Signed)
Referring Physician: Boykin Nearing, MD  Patient name: Sabrina Wilcox MRN: [id number] DOB: April 08, 1940 Sex: female  REASON FOR CONSULT: Leg swelling with pain  HPI: Sabrina Wilcox is a 75 y.o. female, who does not speak English interview was conducted through a [employer] provided interpreter. Daughter was also present for the office visit. Patient complains of intermittent swelling in both lower extremities. This becomes worse if her feet are in a dependent position. It also progresses as the day moved on. She has tried some compression stockings a few years ago did not tolerate them due to pain. She denies any prior history of DVT. She has had no prior lower extremity procedures. Her daughter states that recent evaluation of her heart and kidneys was normal. She also describes sensation of pins and needles in her feet on occasion. She denies claudication. Other medical problems include chronic constipation, hyperlipidemia, depression, hypertension, prior stroke (asymptomatic seen on MRI) all of which are stable.  Past Medical History:  Diagnosis Date  . Allergy   . Anal or rectal pain    having this pain for many many years   . Arthritis    right middle finger  . Bronchitis   . Constipation    uses miralax   . Depression   . Hyperlipidemia   . Hypertension   . Stroke [facility])    undertermined age stroke per mri 06-02-15   Past Surgical History:  Procedure Laterality Date  . COLONOSCOPY    . PARTIAL HYSTERECTOMY    . UPPER GASTROINTESTINAL ENDOSCOPY     in [place]     Family History  Problem Relation Age of Onset  . Colon cancer Neg Hx   . Crohn's disease Neg Hx   . Rectal cancer Neg Hx   . Stomach cancer Neg Hx     SOCIAL HISTORY: Social History   Social History  . Marital status: Widowed    Spouse name: N/A  . Number of children: N/A  . Years of education: N/A   Occupational History  . Not on file.   Social History Main Topics  . Smoking status: Never  Smoker  . Smokeless tobacco: Never Used  . Alcohol use No  . Drug use: No  . Sexual activity: Not on file   Other Topics Concern  . Not on file   Social History Narrative  . No narrative on file    No Known Allergies  Current Outpatient Prescriptions  Medication Sig Dispense Refill  . acetaminophen (TYLENOL 8 HOUR) 650 MG CR tablet Take 1 tablet (650 mg total) by mouth every 8 (eight) hours as needed for pain. 60 tablet 2  . aspirin EC 81 MG tablet Take 1 tablet (81 mg total) by mouth daily. 30 tablet 11  . atorvastatin (LIPITOR) 40 MG tablet Take 1 tablet (40 mg total) by mouth daily. 90 tablet 3  . Calcium Citrate 200 MG TABS Take 2 tablets (400 mg total) by mouth daily.  0  . Cholecalciferol (VITAMIN D3) 2000 units TABS Take 2,000 Units by mouth daily. 30 tablet 11  . Elastic Bandages & Supports (MEDICAL COMPRESSION STOCKINGS) MISC 30 mm Hg pressure 2 each 0  . hydrochlorothiazide (HYDRODIURIL) 25 MG tablet Take 1 tablet (25 mg total) by mouth daily. 30 tablet 2  . polyethylene glycol powder (GLYCOLAX/MIRALAX) powder Take 17 g by mouth daily. 3350 g 1   No current facility-administered medications for this visit.     ROS:   General:  No weight loss, Fever, chills  HEENT: No recent headaches, no nasal bleeding, no visual changes, no sore throat  Neurologic: No dizziness, blackouts, seizures. No recent symptoms of stroke or mini- stroke. No recent episodes of slurred speech, or temporary blindness.  Cardiac: No recent episodes of chest pain/pressure, no shortness of breath at rest.  No shortness of breath with exertion.  Denies history of atrial fibrillation or irregular heartbeat  Vascular: No history of rest pain in feet.  No history of claudication.  No history of non-healing ulcer, No history of DVT   Pulmonary: No home oxygen, no productive cough, no hemoptysis,  No asthma or wheezing  Musculoskeletal:  [ ]  Arthritis, [ ]  Low back pain,  [ ]  Joint  pain  Hematologic:No history of hypercoagulable state.  No history of easy bleeding.  No history of anemia  Gastrointestinal: No hematochezia or melena,  No gastroesophageal reflux, no trouble swallowing  Urinary: [ ]  chronic Kidney disease, [ ]  on HD - [ ]  MWF or [ ]  TTHS, [ ]  Burning with urination, [ ]  Frequent urination, [ ]  Difficulty urinating;   Skin: No rashes  Psychological: No history of anxiety,  + history of depression   Physical Examination  Vitals:   11/02/15 1217  BP: (!) 190/80  Pulse: 62  Resp: 16  Temp: 97.2 F (36.2 C)  TempSrc: Oral  SpO2: 97%  Weight: 172 lb (78 kg)  Height: 5' (1.524 m)   General:  Alert and oriented, no acute distress HEENT: Normal Neck: No bruit or JVD Pulmonary: Clear to auscultation bilaterally Cardiac: Regular Rate and Rhythm without murmur Abdomen: Soft, non-tender, non-distended, no mass Skin: No rash, no obvious surface varicosities Extremity Pulses:  2+ radial, brachial, femoral, dorsalis pedis, posterior tibial pulses bilaterally Musculoskeletal: No deformity trace edema  Neurologic: Upper and lower extremity motor 5/5 and symmetric  DATA:  Patient had a venous reflux exam today. This did show some evidence of deep vein reflux mild on the left more significant on the right no real significant superficial venous reflux  ASSESSMENT:  Bilateral leg swelling probably multifactorial. No evidence of arterial occlusive disease. 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   PLAN:  Discussed the patient today wearing bilateral lower extremity compression stockings. Since she has not worn these in the past because she has been intolerant from pain. We described to her and showed her daughter today how to wrap her lower extremities with Ace wraps that hopefully she can adjust over time and eventually wear some type of compression stocking. She will follow-up on as-needed basis.  Ruta Hinds,  MD [facility] Office: [phone] Pager: [phone]

## 2015-11-13 ENCOUNTER — Telehealth: Payer: Self-pay | Admitting: Family Medicine

## 2015-11-13 DIAGNOSIS — I10 Essential (primary) hypertension: Secondary | ICD-10-CM

## 2015-11-13 NOTE — Telephone Encounter (Signed)
Pt. Called requesting a refill on hydrochlorothiazide (HYDRODIURIL) 25 MG tablet. Pt. Would like the Rx to be sent to [facility]  on [address]. Please f/u

## 2015-11-14 MED ORDER — HYDROCHLOROTHIAZIDE 25 MG PO TABS: 25.0000 mg | ORAL_TABLET | Freq: Every day | ORAL | 2 refills | Status: DC

## 2015-11-14 MED FILL — HYDROCHLOROTHIAZIDE 25 MG T: 25 | 30 days supply | Qty: 30 | Fill #0

## 2015-11-14 NOTE — Telephone Encounter (Signed)
HCTZ refilled. 

## 2015-11-17 ENCOUNTER — Encounter: Payer: Self-pay | Admitting: Family Medicine

## 2015-11-17 ENCOUNTER — Other Ambulatory Visit: Payer: Self-pay

## 2015-11-17 ENCOUNTER — Ambulatory Visit: Payer: Self-pay | Attending: Family Medicine | Admitting: Family Medicine

## 2015-11-17 VITALS — BP 175/73 | HR 69 | Temp 98.6°F | Ht 60.0 in | Wt 174.0 lb

## 2015-11-17 DIAGNOSIS — I10 Essential (primary) hypertension: Secondary | ICD-10-CM | POA: Insufficient documentation

## 2015-11-17 DIAGNOSIS — M79605 Pain in left leg: Secondary | ICD-10-CM | POA: Insufficient documentation

## 2015-11-17 DIAGNOSIS — R0789 Other chest pain: Secondary | ICD-10-CM | POA: Insufficient documentation

## 2015-11-17 DIAGNOSIS — M79604 Pain in right leg: Secondary | ICD-10-CM | POA: Insufficient documentation

## 2015-11-17 DIAGNOSIS — E785 Hyperlipidemia, unspecified: Secondary | ICD-10-CM | POA: Insufficient documentation

## 2015-11-17 LAB — BASIC METABOLIC PANEL WITH GFR
BUN: 18 mg/dL (ref 7–25)
CO2: 32 mmol/L — ABNORMAL HIGH (ref 20–31)
Calcium: 8.9 mg/dL (ref 8.6–10.4)
Chloride: 101 mmol/L (ref 98–110)
Creat: 0.82 mg/dL (ref 0.60–0.93)
GFR, EST AFRICAN AMERICAN: 81 mL/min (ref >=60)
GFR, EST NON AFRICAN AMERICAN: 70 mL/min (ref >=60)
GLUCOSE: 129 mg/dL — AB (ref 65–99)
POTASSIUM: 3.7 mmol/L (ref 3.5–5.3)
Sodium: 142 mmol/L (ref 135–146)

## 2015-11-17 LAB — VITAMIN B12: Vitamin B-12: 1846 pg/mL — ABNORMAL HIGH (ref 200–1100)

## 2015-11-17 MED ORDER — FUROSEMIDE 40 MG PO TABS: 40.0000 mg | ORAL_TABLET | Freq: Every day | ORAL | 2 refills | Status: DC

## 2015-11-17 MED ORDER — TRAMADOL HCL 50 MG PO TABS: 50.0000 mg | ORAL_TABLET | Freq: Three times a day (TID) | ORAL | 0 refills | Status: DC | PRN

## 2015-11-17 MED ORDER — CARVEDILOL 3.125 MG PO TABS: 3.1250 mg | ORAL_TABLET | Freq: Two times a day (BID) | ORAL | 3 refills | Status: DC

## 2015-11-17 MED FILL — ?FUROSEMIDE 40 MG TABLET: 40 | 30 days supply | Qty: 30 | Fill #0

## 2015-11-17 MED FILL — CARVEDILOL 3.125 MG TABLET: 3.125 | 30 days supply | Qty: 60 | Fill #0

## 2015-11-17 MED FILL — traMADol HCL 50 MG TABS: 50 | 10 days supply | Qty: 30 | Fill #0

## 2015-11-17 NOTE — Progress Notes (Signed)
C/C: patient states that feet and legs hurt. Patent also states that her skin hurt when you touch it.

## 2015-11-17 NOTE — Patient Instructions (Addendum)
Sabrina Wilcox was seen today for foot pain.  Diagnoses and all orders for this visit:  Chest pressure -     Ambulatory referral to Cardiology -     EKG 12-Lead -     carvedilol (COREG) 3.125 MG tablet; Take 1 tablet (3.125 mg total) by mouth 2 (two) times daily with a meal. -     furosemide (LASIX) 40 MG tablet; Take 1 tablet (40 mg total) by mouth daily. -     DG Chest 2 View; Future  Essential hypertension -     EKG 12-Lead -     BASIC METABOLIC PANEL WITH GFR -     carvedilol (COREG) 3.125 MG tablet; Take 1 tablet (3.125 mg total) by mouth 2 (two) times daily with a meal. -     furosemide (LASIX) 40 MG tablet; Take 1 tablet (40 mg total) by mouth daily. -     DG Chest 2 View; Future  Leg pain, bilateral -     Vitamin D, 25-hydroxy -     Vitamin B12 -     BASIC METABOLIC PANEL WITH GFR -     traMADol (ULTRAM) 50 MG tablet; Take 1 tablet (50 mg total) by mouth every 8 (eight) hours as needed.    EKG with normal rhythm, similar to last EKG with slight elevation in ST in inferior leads  You will be called with lab results  Stop HCTZ Start lasix 40 mg daily Start coreg 3.125 mg BID  F/u in 2 weeks for BP check and check of leg pains  Dr. Adrian Blackwater

## 2015-11-17 NOTE — Progress Notes (Signed)
Subjective:  Patient ID: Sabrina Wilcox, female    DOB: [birth year]  Age: 75 y.o. MRN: [id number]  CC: Foot Pain   HPI Sabrina Wilcox has HTN, hx of CVA, HLD she  presents for   1. Leg pain: pain and mild swelling in both feet and legs. Burning sensation and sensitivity to touch.   2. HTN: she has been out of HCTZ for 3 days. She admits to intermittent chest pressure and numbness in L arm. She does not have CP at the moment. She denies SOB. She has some pain and swelling in her legs. She is compliant with daily ASA and lipitor.    Social History  Substance Use Topics  . Smoking status: Never Smoker  . Smokeless tobacco: Never Used  . Alcohol use No    Outpatient Medications Prior to Visit  Medication Sig Dispense Refill  . acetaminophen (TYLENOL 8 HOUR) 650 MG CR tablet Take 1 tablet (650 mg total) by mouth every 8 (eight) hours as needed for pain. 60 tablet 2  . aspirin EC 81 MG tablet Take 1 tablet (81 mg total) by mouth daily. 30 tablet 11  . atorvastatin (LIPITOR) 40 MG tablet Take 1 tablet (40 mg total) by mouth daily. 90 tablet 3  . Calcium Citrate 200 MG TABS Take 2 tablets (400 mg total) by mouth daily.  0  . Cholecalciferol (VITAMIN D3) 2000 units TABS Take 2,000 Units by mouth daily. 30 tablet 11  . Elastic Bandages & Supports (MEDICAL COMPRESSION STOCKINGS) MISC 30 mm Hg pressure 2 each 0  . hydrochlorothiazide (HYDRODIURIL) 25 MG tablet Take 1 tablet (25 mg total) by mouth daily. 30 tablet 2  . polyethylene glycol powder (GLYCOLAX/MIRALAX) powder Take 17 g by mouth daily. 3350 g 1   No facility-administered medications prior to visit.     ROS Review of Systems  Constitutional: Negative for chills and fever.  Eyes: Negative for visual disturbance.  Respiratory: Negative for shortness of breath.   Cardiovascular: Positive for chest pain and leg swelling.  Gastrointestinal: Negative for blood in stool.  Musculoskeletal: Positive for back pain and  myalgias. Negative for arthralgias.  Skin: Negative for rash.  Allergic/Immunologic: Negative for immunocompromised state.  Hematological: Negative for adenopathy. Does not bruise/bleed easily.  Psychiatric/Behavioral: Negative for dysphoric mood and suicidal ideas.   ll Objective:  BP (!) 175/73 (BP Location: Left Arm, Patient Position: Sitting, Cuff Size: Large) Comment: patient has not had medication in 3 days  Pulse 69   Temp 98.6 F (37 C) (Oral)   Ht 5' (1.524 m)   Wt 174 lb (78.9 kg)   SpO2 99%   BMI 33.98 kg/m   BP/Weight 11/17/2015 [id number]  Systolic BP 0000000 [id number]  Diastolic BP 73 80 68  Wt. (Lbs) 174 172 166  BMI 33.98 33.59 32.42   Physical Exam  Constitutional: She is oriented to person, place, and time. She appears well-developed and well-nourished. No distress.  HENT:  Head: Normocephalic and atraumatic.  Cardiovascular: Normal rate, regular rhythm, normal heart sounds and intact distal pulses.   Pulmonary/Chest: Effort normal and breath sounds normal.  Musculoskeletal: She exhibits edema (trace b/l LE edema from feet to knees ).  Neurological: She is alert and oriented to person, place, and time.  Skin: Skin is warm and dry. No rash noted.  Psychiatric: She has a normal mood and affect.   EKG: slight ST elevation in inferior leads that is improved from previous tracings.  Assessment &  Plan:  Cadyn was seen today for foot pain.  Diagnoses and all orders for this visit:  Chest pressure -     Ambulatory referral to Cardiology -     EKG 12-Lead -     carvedilol (COREG) 3.125 MG tablet; Take 1 tablet (3.125 mg total) by mouth 2 (two) times daily with a meal. -     furosemide (LASIX) 40 MG tablet; Take 1 tablet (40 mg total) by mouth daily. -     DG Chest 2 View; Future  Essential hypertension -     EKG 12-Lead -     BASIC METABOLIC PANEL WITH GFR -     carvedilol (COREG) 3.125 MG tablet; Take 1 tablet (3.125 mg total) by mouth 2 (two) times daily  with a meal. -     furosemide (LASIX) 40 MG tablet; Take 1 tablet (40 mg total) by mouth daily. -     DG Chest 2 View; Future  Leg pain, bilateral -     Vitamin D, 25-hydroxy -     Vitamin B12 -     BASIC METABOLIC PANEL WITH GFR -     traMADol (ULTRAM) 50 MG tablet; Take 1 tablet (50 mg total) by mouth every 8 (eight) hours as needed.   There are no diagnoses linked to this encounter.  No orders of the defined types were placed in this encounter.   Follow-up: Return in about 2 weeks (around 12/01/2015) for chest pain .   Boykin Nearing MD

## 2015-11-18 LAB — VITAMIN D 25 HYDROXY (VIT D DEFICIENCY, FRACTURES): Vit D, 25-Hydroxy: 40 ng/mL (ref 30–100)

## 2015-11-20 DIAGNOSIS — R0789 Other chest pain: Secondary | ICD-10-CM | POA: Insufficient documentation

## 2015-11-20 NOTE — Assessment & Plan Note (Signed)
Intermittent Chest pressure in elderly female with HTN and abnormal EKG  Plan: Cards referral Start coreg  Add lasix for edema Continue ASA and statin

## 2015-11-22 ENCOUNTER — Telehealth: Payer: Self-pay

## 2015-11-22 ENCOUNTER — Ambulatory Visit: Payer: Self-pay

## 2015-11-22 NOTE — Telephone Encounter (Signed)
Patient was informed of results 

## 2015-11-24 ENCOUNTER — Encounter: Payer: Self-pay | Admitting: Cardiology

## 2015-11-27 ENCOUNTER — Telehealth: Payer: Self-pay | Admitting: Family Medicine

## 2015-11-27 DIAGNOSIS — Z8673 Personal history of transient ischemic attack (TIA), and cerebral infarction without residual deficits: Secondary | ICD-10-CM

## 2015-11-27 DIAGNOSIS — E785 Hyperlipidemia, unspecified: Secondary | ICD-10-CM

## 2015-11-27 NOTE — Telephone Encounter (Signed)
Pt's daughter called to request a refill for atorvastatin (LIPITOR) 40 MG tablet. Pt only has 4 pills left. They would like it to be called to our pharmacy. Please follow up.  Thank you

## 2015-11-29 MED ORDER — ATORVASTATIN CALCIUM 40 MG PO TABS: 40.0000 mg | ORAL_TABLET | Freq: Every day | ORAL | 3 refills | Status: DC

## 2015-11-29 NOTE — Telephone Encounter (Signed)
Done

## 2015-11-30 MED FILL — ?ATORVASTATIN 40MG TABLET: 40 | 30 days supply | Qty: 30 | Fill #0 | Status: TO

## 2015-12-06 NOTE — Telephone Encounter (Signed)
Called and left message for patient to inform her that prescription has been filled.

## 2015-12-07 ENCOUNTER — Ambulatory Visit: Payer: Self-pay | Admitting: Family Medicine

## 2015-12-11 ENCOUNTER — Encounter: Payer: Self-pay | Admitting: Cardiology

## 2015-12-11 ENCOUNTER — Ambulatory Visit (INDEPENDENT_AMBULATORY_CARE_PROVIDER_SITE_OTHER): Payer: Self-pay | Admitting: Cardiology

## 2015-12-11 VITALS — BP 126/70 | HR 64 | Ht 59.0 in | Wt 173.4 lb

## 2015-12-11 DIAGNOSIS — R0609 Other forms of dyspnea: Secondary | ICD-10-CM

## 2015-12-11 DIAGNOSIS — R079 Chest pain, unspecified: Secondary | ICD-10-CM

## 2015-12-11 DIAGNOSIS — Z8673 Personal history of transient ischemic attack (TIA), and cerebral infarction without residual deficits: Secondary | ICD-10-CM

## 2015-12-11 DIAGNOSIS — R600 Localized edema: Secondary | ICD-10-CM | POA: Insufficient documentation

## 2015-12-11 DIAGNOSIS — I10 Essential (primary) hypertension: Secondary | ICD-10-CM

## 2015-12-11 NOTE — Progress Notes (Signed)
Cardiology Office Note    Date:  12/11/2015   ID:  Sabrina Wilcox, Sabrina Wilcox Aug 17, 1940, MRN [id number]  PCP:  Minerva Ends, MD  Cardiologist:   Candee Furbish, MD     History of Present Illness:  Sabrina Wilcox is a 75 y.o. female here for evaluation of chest pressure and blood pressure at the request of Dr. Adrian Blackwater. Found that BP was very high. Occasionally she may feel chest pressure, central chest, heart for her to fully describe. Does not appear to be exertional. Can occur at rest. If she eats warm food sometime she has discomfort. This is not always the case however. She wonders that this is because of high cholesterol. She also states that she has dyspnea on exertion. She is also been battling lower extremity edema as well as painful neuropathy like symptoms in her feet bilaterally.  She has never smoked. Her mother and father did not have early heart disease. She is a nondiabetic. Her glucose was mildly elevated at 129. She has had lower extremity Dopplers which showed no evidence of DVT.  She does have a history of stroke according to past medical history records. Should a history of syncope while vacuuming, blood pressure was quite elevated. 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 No acute stroke.  Past Medical History:  Diagnosis Date  . Allergy   . Anal or rectal pain    having this pain for many many years   . Arthritis    right middle finger  . Bronchitis   . Constipation    uses miralax   . Depression   . Hyperlipidemia   . Hypertension   . Stroke [facility])    undertermined age stroke per mri 06-02-15    Past Surgical History:  Procedure Laterality Date  . COLONOSCOPY    . PARTIAL HYSTERECTOMY    . UPPER GASTROINTESTINAL ENDOSCOPY     in [place]     Current Medications: Outpatient Medications Prior to Visit  Medication Sig Dispense Refill  . acetaminophen (TYLENOL 8 HOUR) 650 MG CR tablet Take 1 tablet (650 mg total) by mouth every 8 (eight)  hours as needed for pain. 60 tablet 2  . aspirin EC 81 MG tablet Take 1 tablet (81 mg total) by mouth daily. 30 tablet 11  . atorvastatin (LIPITOR) 40 MG tablet Take 1 tablet (40 mg total) by mouth daily. 90 tablet 3  . Calcium Citrate 200 MG TABS Take 2 tablets (400 mg total) by mouth daily.  0  . carvedilol (COREG) 3.125 MG tablet Take 1 tablet (3.125 mg total) by mouth 2 (two) times daily with a meal. 60 tablet 3  . Cholecalciferol (VITAMIN D3) 2000 units TABS Take 2,000 Units by mouth daily. 30 tablet 11  . Elastic Bandages & Supports (MEDICAL COMPRESSION STOCKINGS) MISC 30 mm Hg pressure 2 each 0  . polyethylene glycol powder (GLYCOLAX/MIRALAX) powder Take 17 g by mouth daily. 3350 g 1  . furosemide (LASIX) 40 MG tablet Take 1 tablet (40 mg total) by mouth daily. 30 tablet 2  . traMADol (ULTRAM) 50 MG tablet Take 1 tablet (50 mg total) by mouth every 8 (eight) hours as needed. 30 tablet 0   No facility-administered medications prior to visit.      Allergies:   Review of patient's allergies indicates no known allergies.   Social History   Social History  . Marital status: Widowed    Spouse name: N/A  . Number of children: N/A  .  Years of education: N/A   Social History Main Topics  . Smoking status: Never Smoker  . Smokeless tobacco: Never Used  . Alcohol use No  . Drug use: No  . Sexual activity: Not Asked   Other Topics Concern  . None   Social History Narrative  . None     Family History: No early CAD history   ROS:   Please see the history of present illness.   No bleeding, no syncope. She has been depressed. ROS All other systems reviewed and are negative.   PHYSICAL EXAM:   VS:  BP 126/70   Pulse 64   Ht 4\' 11"  (1.499 m)   Wt 173 lb 6.4 oz (78.7 kg)   BMI 35.02 kg/m    GEN: Well nourished, well developed, in no acute distress  HEENT: normal  Neck: no JVD, carotid bruits, or masses Cardiac: RRR; 2/6 SM LUSB/apex, no rubs, or gallops,no edema    Respiratory:  clear to auscultation bilaterally, normal work of breathing GI: soft, nontender, nondistended, + BS MS: no deformity or atrophy  Skin: warm and dry, no rash Neuro:  Alert and Oriented x 3, Strength and sensation are intact Psych: euthymic mood, full affect  Wt Readings from Last 3 Encounters:  12/11/15 173 lb 6.4 oz (78.7 kg)  11/17/15 174 lb (78.9 kg)  11/02/15 172 lb (78 kg)      Studies/Labs Reviewed:   EKG:  EKG from 11/17/15 - NSR, NSSTW changes, 67bpm, prolonged Qtc. Personally reviewed  Recent Labs: 06/03/2015: ALT 16; Magnesium 1.9; Platelets 186 07/19/2015: Hemoglobin 13.9 07/21/2015: Pro B Natriuretic peptide (BNP) 95.22 11/17/2015: BUN 18; Creat 0.82; Potassium 3.7; Sodium 142   Lipid Panel    Component Value Date/Time   CHOL 172 06/20/2015 1745   TRIG 307 (H) 06/20/2015 1745   HDL 45 (L) 06/20/2015 1745   CHOLHDL 3.8 06/20/2015 1745   VLDL 61 (H) 06/20/2015 1745   LDLCALC 66 06/20/2015 1745    Additional studies/ records that were reviewed today include:   Prior office notes, lab work, vascular studies reviewed. EKG reviewed personally.   ASSESSMENT:    1. Dyspnea on exertion   2. Chest pain, unspecified chest pain type   3. Bilateral edema of lower extremity   4. History of stroke   5. Essential hypertension      PLAN:  In order of problems listed above:  Atypical chest pain  - Also has some shortness of breath at times with exertional activity. It is hard for her to pinpoint exactly when chest pressure occurs. Sometimes she feels it in eating warm or hot food, could be GERD related however this does not always correlate. I think given her advanced age, previously difficult to control hypertension as a risk factor, we should pursue an ischemic evaluation. Nuclear stress test, Lexiscan. Has a history of stroke. Likely secondary to hypertension. Continue with secondary prevention efforts.  Dyspnea  - We will check an echocardiogram to  ensure proper structure and function of her heart. She does have 1-2+ lower extremity edema bilaterally which is chronic. No evidence of DVT thankfully. Will make sure that she does not have any evidence of significant pulmonary hypertension.  Lower extremity edema  - Dr. about compression hose and she is try this in the past and could not tolerate. She is currently taking Lasix, continue. Elevate legs. Avoid salt. She does enjoy salty foods. We discussed at length with the help of the translator. Thankfully, BNP is  normal. This may be venous insufficiency. Conservative management.  Essential hypertension  - Blood pressure is excellent today. Continue with current regimen. It was quite elevated at prior visit with Dr. Adrian Blackwater.  She is concerned about cost of tests. She will be discussing with appropriate counselors. They currently have a 100% discount card. I am unsure if this applies to further cardiac testing.    Medication Adjustments/Labs and Tests Ordered: Current medicines are reviewed at length with the patient today.  Concerns regarding medicines are outlined above.  Medication changes, Labs and Tests ordered today are listed in the Patient Instructions below. Patient Instructions  Medication Instructions:  The current medical regimen is effective;  continue present plan and medications.  Testing/Procedures: Your physician has requested that you have an echocardiogram. Echocardiography is a painless test that uses sound waves to create images of your heart. It provides your doctor with information about the size and shape of your heart and how well your heart's chambers and valves are working. This procedure takes approximately one hour. There are no restrictions for this procedure.  Your physician has requested that you have a lexiscan myoview. For further information please visit [email or web address]. Please follow instruction sheet, as given.  Follow-Up: Follow up as needed after  testing has been completed.  Thank you for choosing [facility]!!        Signed, Candee Furbish, MD  12/11/2015 12:17 PM    [address], [place], [place]  [address] Phone: [phone]; Fax: [phone]

## 2015-12-11 NOTE — Patient Instructions (Addendum)
Medication Instructions:  The current medical regimen is effective;  continue present plan and medications.  Testing/Procedures: Your physician has requested that you have an echocardiogram. Echocardiography is a painless test that uses sound waves to create images of your heart. It provides your doctor with information about the size and shape of your heart and how well your heart's chambers and valves are working. This procedure takes approximately one hour. There are no restrictions for this procedure.  Your physician has requested that you have a lexiscan myoview. For further information please visit [email or web address]. Please follow instruction sheet, as given.  Follow-Up: Follow up as needed after testing has been completed.  Thank you for choosing [facility]!!

## 2015-12-18 ENCOUNTER — Ambulatory Visit: Payer: Self-pay | Attending: Family Medicine | Admitting: Family Medicine

## 2015-12-18 ENCOUNTER — Encounter: Payer: Self-pay | Admitting: Family Medicine

## 2015-12-18 VITALS — BP 148/78 | HR 72 | Temp 97.9°F | Ht 59.0 in | Wt 171.2 lb

## 2015-12-18 DIAGNOSIS — M549 Dorsalgia, unspecified: Secondary | ICD-10-CM | POA: Insufficient documentation

## 2015-12-18 DIAGNOSIS — M7989 Other specified soft tissue disorders: Secondary | ICD-10-CM | POA: Insufficient documentation

## 2015-12-18 DIAGNOSIS — I872 Venous insufficiency (chronic) (peripheral): Secondary | ICD-10-CM

## 2015-12-18 DIAGNOSIS — M79605 Pain in left leg: Secondary | ICD-10-CM | POA: Insufficient documentation

## 2015-12-18 DIAGNOSIS — Z79899 Other long term (current) drug therapy: Secondary | ICD-10-CM | POA: Insufficient documentation

## 2015-12-18 DIAGNOSIS — Z8673 Personal history of transient ischemic attack (TIA), and cerebral infarction without residual deficits: Secondary | ICD-10-CM | POA: Insufficient documentation

## 2015-12-18 DIAGNOSIS — G8929 Other chronic pain: Secondary | ICD-10-CM

## 2015-12-18 DIAGNOSIS — Z23 Encounter for immunization: Secondary | ICD-10-CM

## 2015-12-18 DIAGNOSIS — R0789 Other chest pain: Secondary | ICD-10-CM

## 2015-12-18 DIAGNOSIS — E785 Hyperlipidemia, unspecified: Secondary | ICD-10-CM | POA: Insufficient documentation

## 2015-12-18 DIAGNOSIS — J309 Allergic rhinitis, unspecified: Secondary | ICD-10-CM

## 2015-12-18 DIAGNOSIS — H00019 Hordeolum externum unspecified eye, unspecified eyelid: Secondary | ICD-10-CM | POA: Insufficient documentation

## 2015-12-18 DIAGNOSIS — H00013 Hordeolum externum right eye, unspecified eyelid: Secondary | ICD-10-CM

## 2015-12-18 DIAGNOSIS — M79604 Pain in right leg: Secondary | ICD-10-CM

## 2015-12-18 DIAGNOSIS — Z7982 Long term (current) use of aspirin: Secondary | ICD-10-CM | POA: Insufficient documentation

## 2015-12-18 DIAGNOSIS — I10 Essential (primary) hypertension: Secondary | ICD-10-CM

## 2015-12-18 MED ORDER — CARVEDILOL 6.25 MG PO TABS: 6.2500 mg | ORAL_TABLET | Freq: Two times a day (BID) | ORAL | 3 refills | Status: DC

## 2015-12-18 MED ORDER — CETIRIZINE HCL 5 MG PO TABS: 5.0000 mg | ORAL_TABLET | Freq: Every day | ORAL | 5 refills | Status: DC

## 2015-12-18 MED ORDER — FLUTICASONE PROPIONATE 50 MCG/ACT NA SUSP: 2.0000 | Freq: Every day | NASAL | 6 refills | Status: AC

## 2015-12-18 MED ORDER — TRAMADOL HCL 50 MG PO TABS: 50.0000 mg | ORAL_TABLET | Freq: Two times a day (BID) | ORAL | 2 refills | Status: DC | PRN

## 2015-12-18 MED FILL — FLUTICASONE PROP 50 MCG SPR: 50 | 30 days supply | Qty: 16 | Fill #0

## 2015-12-18 MED FILL — ?CARVEDILOL 3.125 MG TABLET: 3.125 | 30 days supply | Qty: 60 | Fill #1

## 2015-12-18 MED FILL — traMADol HCL 50 MG TABS: 50 | 30 days supply | Qty: 60 | Fill #0

## 2015-12-18 NOTE — Assessment & Plan Note (Signed)
A: improved P: Continue coreg increase to 6.25 mg bID  Keep f/u with cardiology for cardiac testing, ECHO and lexiscan stress test

## 2015-12-18 NOTE — Patient Instructions (Addendum)
Sabrina Wilcox was seen today for follow-up.  Diagnoses and all orders for this visit:  Allergic rhinitis, unspecified allergic rhinitis type -     fluticasone (FLONASE) 50 MCG/ACT nasal spray; Place 2 sprays into both nostrils daily. -     cetirizine (ZYRTEC) 5 MG tablet; Take 1 tablet (5 mg total) by mouth at bedtime.  Chronic back pain -     traMADol (ULTRAM) 50 MG tablet; Take 1 tablet (50 mg total) by mouth every 12 (twelve) hours as needed.  Leg pain, bilateral -     traMADol (ULTRAM) 50 MG tablet; Take 1 tablet (50 mg total) by mouth every 12 (twelve) hours as needed.  Chest pressure -     carvedilol (COREG) 6.25 MG tablet; Take 1 tablet (6.25 mg total) by mouth 2 (two) times daily with a meal.  Essential hypertension -     carvedilol (COREG) 6.25 MG tablet; Take 1 tablet (6.25 mg total) by mouth 2 (two) times daily with a meal.  Other orders -     Flu Vaccine QUAD 36+ mos IM    F/u in 3 months for HTN   Dr. Adrian Blackwater

## 2015-12-18 NOTE — Assessment & Plan Note (Signed)
Stye on R lower lid Advise warm compress for 10 minutes 3 times a day

## 2015-12-18 NOTE — Assessment & Plan Note (Signed)
Slightly improved from last OV Continue to elevate legs Low salt diet

## 2015-12-18 NOTE — Progress Notes (Signed)
Subjective:  Patient ID: Sabrina Wilcox, female    DOB: [birth year]  Age: 75 y.o. MRN: [id number]  CC: Follow-up   HPI Sabrina Wilcox has hx of HTN, CVA, HLD, chronic back pain, chronic leg swelling she  presents with her daughter  for    1. HTN and chest pain: she is compliant and tolerating coreg. She has cut down on salty foods since cardiology visit earlier this week. She denies recent chest pain episode.   2. R eye redness: redness and swelling in R lower eye lid associated with itching in eye, itching in nose and itching in throat x 2 weeks. Also with ringing in ears.  Redness and swelling in her R lower eyelid  has improved.   3. Leg pain: still with pain and some swelling in legs. Tramadol helped with pain, request refill. Elevating legs. She could not tolerate the compression stockings.   Social History  Substance Use Topics  . Smoking status: Never Smoker  . Smokeless tobacco: Never Used  . Alcohol use No    Outpatient Medications Prior to Visit  Medication Sig Dispense Refill  . acetaminophen (TYLENOL 8 HOUR) 650 MG CR tablet Take 1 tablet (650 mg total) by mouth every 8 (eight) hours as needed for pain. 60 tablet 2  . aspirin EC 81 MG tablet Take 1 tablet (81 mg total) by mouth daily. 30 tablet 11  . atorvastatin (LIPITOR) 40 MG tablet Take 1 tablet (40 mg total) by mouth daily. 90 tablet 3  . Calcium Citrate 200 MG TABS Take 2 tablets (400 mg total) by mouth daily.  0  . carvedilol (COREG) 3.125 MG tablet Take 1 tablet (3.125 mg total) by mouth 2 (two) times daily with a meal. 60 tablet 3  . Cholecalciferol (VITAMIN D3) 2000 units TABS Take 2,000 Units by mouth daily. 30 tablet 11  . Elastic Bandages & Supports (MEDICAL COMPRESSION STOCKINGS) MISC 30 mm Hg pressure 2 each 0  . polyethylene glycol powder (GLYCOLAX/MIRALAX) powder Take 17 g by mouth daily. 3350 g 1   No facility-administered medications prior to visit.     ROS Review of Systems    Constitutional: Negative for chills and fever.  Eyes: Positive for redness and itching. Negative for visual disturbance.  Respiratory: Negative for shortness of breath.   Cardiovascular: Positive for leg swelling. Negative for chest pain.  Gastrointestinal: Negative for abdominal pain and blood in stool.  Musculoskeletal: Positive for arthralgias and myalgias. Negative for back pain.  Skin: Negative for rash.  Allergic/Immunologic: Negative for immunocompromised state.  Hematological: Negative for adenopathy. Does not bruise/bleed easily.  Psychiatric/Behavioral: Negative for dysphoric mood and suicidal ideas.    Objective:  BP (!) 148/78   Pulse 72   Temp 97.9 F (36.6 C) (Oral)   Ht 4\' 11"  (1.499 m)   Wt 171 lb 3.2 oz (77.7 kg)   SpO2 97%   BMI 34.58 kg/m   BP/Weight 12/18/2015 12/11/2015 [id number]  Systolic BP 123456 [id number] 0000000  Diastolic BP 78 70 73  Wt. (Lbs) 171.2 173.4 174  BMI 34.58 35.02 33.98   Physical Exam  Constitutional: She is oriented to person, place, and time. She appears well-developed and well-nourished. No distress.  HENT:  Head: Normocephalic and atraumatic.  Right Ear: Tympanic membrane and external ear normal.  Left Ear: Tympanic membrane and external ear normal.  Ears:  Nose: Mucosal edema present.  Eyes:    Cardiovascular: Normal rate, regular rhythm, normal heart sounds and intact  distal pulses.   Pulmonary/Chest: Effort normal and breath sounds normal.  Musculoskeletal: She exhibits edema (trace in b/l LE ).  Neurological: She is alert and oriented to person, place, and time.  Skin: Skin is warm and dry. No rash noted.  Psychiatric: She has a normal mood and affect.     Assessment & Plan:  Sabrina Wilcox was seen today for follow-up.  Diagnoses and all orders for this visit:  Allergic rhinitis, unspecified allergic rhinitis type -     fluticasone (FLONASE) 50 MCG/ACT nasal spray; Place 2 sprays into both nostrils daily. -     cetirizine (ZYRTEC) 5  MG tablet; Take 1 tablet (5 mg total) by mouth at bedtime.  Chronic back pain -     traMADol (ULTRAM) 50 MG tablet; Take 1 tablet (50 mg total) by mouth every 12 (twelve) hours as needed.  Leg pain, bilateral -     traMADol (ULTRAM) 50 MG tablet; Take 1 tablet (50 mg total) by mouth every 12 (twelve) hours as needed.  Chest pressure -     carvedilol (COREG) 6.25 MG tablet; Take 1 tablet (6.25 mg total) by mouth 2 (two) times daily with a meal.  Essential hypertension -     carvedilol (COREG) 6.25 MG tablet; Take 1 tablet (6.25 mg total) by mouth 2 (two) times daily with a meal.  Other orders -     Flu Vaccine QUAD 36+ mos IM   There are no diagnoses linked to this encounter.  No orders of the defined types were placed in this encounter.   Follow-up: Return in about 3 months (around 03/18/2016) for HTN .   Boykin Nearing MD

## 2015-12-18 NOTE — Assessment & Plan Note (Signed)
Air fluid levels in ear and swollen nasal turbinates Consistent with rhinitis with eustachian tube dysfunction  Add Flonase Add zyrtec 5 mg daily

## 2015-12-18 NOTE — Progress Notes (Signed)
BP recheck, pt has had headaches last week, pt has ringing in her ears. Flu shot  Pt wants refill on tramadol

## 2015-12-18 NOTE — Assessment & Plan Note (Signed)
Leg pain in setting of venous insufficiency and edema Refilled tramadol which has helped

## 2015-12-20 ENCOUNTER — Telehealth (HOSPITAL_COMMUNITY): Payer: Self-pay | Admitting: *Deleted

## 2015-12-20 NOTE — Telephone Encounter (Signed)
Patient given detailed instructions per Myocardial Perfusion Study Information Sheet for the test on 12/25/15. Patient notified to arrive 15 minutes early and that it is imperative to arrive on time for appointment to keep from having the test rescheduled.  If you need to cancel or reschedule your appointment, please call the office within 24 hours of your appointment. Failure to do so may result in a cancellation of your appointment, and a $50 no show fee. Patient verbalized understanding. Sabrina Wilcox    

## 2015-12-22 ENCOUNTER — Ambulatory Visit: Payer: Self-pay | Attending: Internal Medicine

## 2015-12-25 ENCOUNTER — Ambulatory Visit (HOSPITAL_BASED_OUTPATIENT_CLINIC_OR_DEPARTMENT_OTHER): Payer: Self-pay

## 2015-12-25 ENCOUNTER — Other Ambulatory Visit: Payer: Self-pay

## 2015-12-25 ENCOUNTER — Ambulatory Visit (HOSPITAL_COMMUNITY): Payer: Self-pay | Attending: Cardiology

## 2015-12-25 ENCOUNTER — Encounter (HOSPITAL_COMMUNITY): Payer: Self-pay | Admitting: *Deleted

## 2015-12-25 DIAGNOSIS — R079 Chest pain, unspecified: Secondary | ICD-10-CM

## 2015-12-25 DIAGNOSIS — R0609 Other forms of dyspnea: Secondary | ICD-10-CM | POA: Insufficient documentation

## 2015-12-25 LAB — MYOCARDIAL PERFUSION IMAGING
CHL CUP NUCLEAR SRS: 4
CHL CUP NUCLEAR SSS: 9
CSEPPHR: 89 {beats}/min
LV dias vol: 69 mL (ref 46–106)
LV sys vol: 17 mL
RATE: 0.23
Rest HR: 52 {beats}/min
SDS: 5
TID: 1

## 2015-12-25 MED ORDER — AMINOPHYLLINE 25 MG/ML IV SOLN
75.0000 mg | Freq: Once | INTRAVENOUS | Status: AC
  Administered 2015-12-25 (×2): 75 mg via INTRAVENOUS

## 2015-12-25 MED ORDER — TECHNETIUM TC 99M TETROFOSMIN IV KIT
30.0000 | PACK | Freq: Once | INTRAVENOUS | Status: AC | PRN
  Administered 2015-12-25: 30 via INTRAVENOUS
  Filled 2015-12-25: qty 30

## 2015-12-25 MED ORDER — TECHNETIUM TC 99M TETROFOSMIN IV KIT
10.2000 | PACK | Freq: Once | INTRAVENOUS | Status: AC | PRN
  Administered 2015-12-25: 10 via INTRAVENOUS
  Filled 2015-12-25: qty 10

## 2015-12-25 MED ORDER — REGADENOSON 0.4 MG/5ML IV SOLN
0.4000 mg | Freq: Once | INTRAVENOUS | Status: AC
Start: 2015-12-25 — End: 2015-12-25
  Administered 2015-12-25: 0.4 mg via INTRAVENOUS

## 2015-12-25 MED ORDER — AMINOPHYLLINE 25 MG/ML IV SOLN: 75.0000 mg | Freq: Once | INTRAVENOUS | Status: AC

## 2015-12-25 NOTE — Progress Notes (Unsigned)
Interpreter Evangeline Dakin present for echocardiogram.

## 2016-01-02 ENCOUNTER — Telehealth: Payer: Self-pay | Admitting: Family Medicine

## 2016-01-02 DIAGNOSIS — E785 Hyperlipidemia, unspecified: Secondary | ICD-10-CM

## 2016-01-02 DIAGNOSIS — Z8673 Personal history of transient ischemic attack (TIA), and cerebral infarction without residual deficits: Secondary | ICD-10-CM

## 2016-01-02 MED ORDER — ATORVASTATIN CALCIUM 40 MG PO TABS: 40.0000 mg | ORAL_TABLET | Freq: Every day | ORAL | 1 refills | Status: DC

## 2016-01-02 MED FILL — ATORVASTATIN 40 MG TABLET: 40 | 30 days supply | Qty: 30 | Fill #0

## 2016-01-02 NOTE — Telephone Encounter (Signed)
Patient needs atorvastatin

## 2016-01-02 NOTE — Telephone Encounter (Signed)
Atorvastatin refilled.  

## 2016-01-22 MED FILL — ?CARVEDILOL 3.125 MG TABLET: 3.125 | 30 days supply | Qty: 60 | Fill #2

## 2016-02-08 ENCOUNTER — Ambulatory Visit: Payer: Self-pay | Attending: Family Medicine | Admitting: Family Medicine

## 2016-02-08 ENCOUNTER — Encounter: Payer: Self-pay | Admitting: Family Medicine

## 2016-02-08 VITALS — BP 181/53 | HR 63 | Temp 98.1°F | Resp 16 | Wt 171.0 lb

## 2016-02-08 DIAGNOSIS — Z79899 Other long term (current) drug therapy: Secondary | ICD-10-CM | POA: Insufficient documentation

## 2016-02-08 DIAGNOSIS — I10 Essential (primary) hypertension: Secondary | ICD-10-CM

## 2016-02-08 DIAGNOSIS — R110 Nausea: Secondary | ICD-10-CM | POA: Insufficient documentation

## 2016-02-08 DIAGNOSIS — R1013 Epigastric pain: Secondary | ICD-10-CM

## 2016-02-08 DIAGNOSIS — Z7982 Long term (current) use of aspirin: Secondary | ICD-10-CM | POA: Insufficient documentation

## 2016-02-08 DIAGNOSIS — I872 Venous insufficiency (chronic) (peripheral): Secondary | ICD-10-CM

## 2016-02-08 DIAGNOSIS — J309 Allergic rhinitis, unspecified: Secondary | ICD-10-CM

## 2016-02-08 LAB — CBC
HEMATOCRIT: 41.2 % (ref 35.0–45.0)
HEMOGLOBIN: 14 g/dL (ref 11.7–15.5)
MCH: 29.9 pg (ref 27.0–33.0)
MCHC: 34 g/dL (ref 32.0–36.0)
MCV: 87.8 fL (ref 80.0–100.0)
MPV: 10.6 fL (ref 7.5–12.5)
Platelets: 213 10*3/uL (ref 140–400)
RBC: 4.69 MIL/uL (ref 3.80–5.10)
RDW: 13.4 % (ref 11.0–15.0)
WBC: 3.6 10*3/uL — ABNORMAL LOW (ref 3.8–10.8)

## 2016-02-08 MED ORDER — RANITIDINE HCL 300 MG PO TABS: 300.0000 mg | ORAL_TABLET | Freq: Every day | ORAL | 1 refills | Status: DC

## 2016-02-08 MED ORDER — FEXOFENADINE HCL 180 MG PO TABS: 180.0000 mg | ORAL_TABLET | Freq: Every day | ORAL | 5 refills | Status: DC

## 2016-02-08 MED ORDER — FUROSEMIDE 40 MG PO TABS: 40.0000 mg | ORAL_TABLET | Freq: Every day | ORAL | 5 refills | Status: DC

## 2016-02-08 MED ORDER — POTASSIUM CHLORIDE CRYS ER 20 MEQ PO TBCR
20.0000 meq | EXTENDED_RELEASE_TABLET | Freq: Every day | ORAL | 5 refills | Status: DC
Start: 2016-02-08 — End: 2016-03-05

## 2016-02-08 MED FILL — ATORVASTATIN 40 MG TABLET: 40 | 30 days supply | Qty: 30 | Fill #1

## 2016-02-08 MED FILL — FUROSEMIDE 40 MG TABLET: 40 | 30 days supply | Qty: 30 | Fill #0

## 2016-02-08 MED FILL — POTASSIUM CL ER 20 MEQ TAB: 20 | 30 days supply | Qty: 30 | Fill #0

## 2016-02-08 MED FILL — raNITIdine HCL 150 MG TABS: 150 | 30 days supply | Qty: 60 | Fill #0

## 2016-02-08 NOTE — Progress Notes (Signed)
Subjective:  Patient ID: Sabrina Wilcox, female    DOB: 1940/08/26  Age: 75 y.o. MRN: [id number]  CC: Abdominal Pain   HPI Sabrina Wilcox presents for    1. Stomach pain: in epigastric area and RUQ. Some nausea. No emesis. No diarrhea or constipation.   2. Burning feet: with swelling. Has chronic venous insufficiency in setting of HTN. Symptoms improved with course of lasix. BP was also improved.   Social History  Substance Use Topics  . Smoking status: Never Smoker  . Smokeless tobacco: Never Used  . Alcohol use No    Outpatient Medications Prior to Visit  Medication Sig Dispense Refill  . acetaminophen (TYLENOL 8 HOUR) 650 MG CR tablet Take 1 tablet (650 mg total) by mouth every 8 (eight) hours as needed for pain. 60 tablet 2  . aspirin EC 81 MG tablet Take 1 tablet (81 mg total) by mouth daily. 30 tablet 11  . atorvastatin (LIPITOR) 40 MG tablet Take 1 tablet (40 mg total) by mouth daily. 90 tablet 1  . Calcium Citrate 200 MG TABS Take 2 tablets (400 mg total) by mouth daily.  0  . carvedilol (COREG) 6.25 MG tablet Take 1 tablet (6.25 mg total) by mouth 2 (two) times daily with a meal. 60 tablet 3  . cetirizine (ZYRTEC) 5 MG tablet Take 1 tablet (5 mg total) by mouth at bedtime. 30 tablet 5  . Cholecalciferol (VITAMIN D3) 2000 units TABS Take 2,000 Units by mouth daily. 30 tablet 11  . Elastic Bandages & Supports (MEDICAL COMPRESSION STOCKINGS) MISC 30 mm Hg pressure 2 each 0  . fluticasone (FLONASE) 50 MCG/ACT nasal spray Place 2 sprays into both nostrils daily. 16 g 6  . polyethylene glycol powder (GLYCOLAX/MIRALAX) powder Take 17 g by mouth daily. 3350 g 1  . traMADol (ULTRAM) 50 MG tablet Take 1 tablet (50 mg total) by mouth every 12 (twelve) hours as needed. 60 tablet 2   Facility-Administered Medications Prior to Visit  Medication Dose Route Frequency Provider Last Rate Last Dose  . aminophylline injection 75 mg  75 mg Intravenous Once Dorothy Spark, MD         ROS Review of Systems  Constitutional: Negative for chills and fever.  Eyes: Positive for redness and itching. Negative for visual disturbance.  Respiratory: Negative for shortness of breath.   Cardiovascular: Positive for leg swelling. Negative for chest pain.  Gastrointestinal: Positive for abdominal pain. Negative for blood in stool.  Musculoskeletal: Positive for arthralgias and myalgias. Negative for back pain.  Skin: Negative for rash.  Allergic/Immunologic: Negative for immunocompromised state.  Neurological: Positive for headaches.  Hematological: Negative for adenopathy. Does not bruise/bleed easily.  Psychiatric/Behavioral: Negative for dysphoric mood and suicidal ideas.    Objective:  BP (!) 181/53 (BP Location: Right Arm, Patient Position: Sitting, Cuff Size: Large)   Pulse 63   Temp 98.1 F (36.7 C) (Oral)   Resp 16   Wt 171 lb (77.6 kg)   SpO2 96%   BMI 34.54 kg/m   BP/Weight 02/08/2016 12/18/2015 0000000  Systolic BP 0000000 [id number]  Diastolic BP 53 78 70  Wt. (Lbs) 171 171.2 173.4  BMI 34.54 34.58 35.02    Physical Exam  Constitutional: She is oriented to person, place, and time. She appears well-developed and well-nourished. No distress.  HENT:  Head: Normocephalic and atraumatic.  Ears:  Nose: Mucosal edema present.  Cardiovascular: Normal rate, regular rhythm, normal heart sounds and intact distal pulses.  Pulmonary/Chest: Effort normal and breath sounds normal.  Abdominal: Soft. Bowel sounds are normal. She exhibits no distension and no mass. There is tenderness in the right upper quadrant and epigastric area. There is no rebound and no guarding.  Musculoskeletal: She exhibits no edema.  Neurological: She is alert and oriented to person, place, and time.  Skin: Skin is warm and dry. No rash noted.  Psychiatric: She has a normal mood and affect.     Assessment & Plan:  Sabrina Wilcox was seen today for abdominal pain.  Diagnoses and all orders for  this visit:  Essential hypertension -     furosemide (LASIX) 40 MG tablet; Take 1 tablet (40 mg total) by mouth daily. -     potassium chloride SA (K-DUR,KLOR-CON) 20 MEQ tablet; Take 1 tablet (20 mEq total) by mouth daily.  Chronic venous insufficiency -     furosemide (LASIX) 40 MG tablet; Take 1 tablet (40 mg total) by mouth daily. -     potassium chloride SA (K-DUR,KLOR-CON) 20 MEQ tablet; Take 1 tablet (20 mEq total) by mouth daily.  Epigastric pain -     Lipase -     COMPLETE METABOLIC PANEL WITH GFR -     CBC -     ranitidine (ZANTAC) 300 MG tablet; Take 1 tablet (300 mg total) by mouth at bedtime.  Chronic allergic rhinitis, unspecified seasonality, unspecified trigger -     fexofenadine (ALLEGRA ALLERGY) 180 MG tablet; Take 1 tablet (180 mg total) by mouth daily.   There are no diagnoses linked to this encounter.  No orders of the defined types were placed in this encounter.   Follow-up: Return in about 3 weeks (around 02/29/2016) for HTN .   Boykin Nearing MD

## 2016-02-08 NOTE — Progress Notes (Signed)
Pt is in the office today for b/l feet warmth Pt states her pain is mild  Pt states sometimes the pain gets worse at night Pt states she also has stomach pain Pt states when her stomach hurts she hears something in her ears

## 2016-02-08 NOTE — Patient Instructions (Addendum)
Sabrina Wilcox was seen today for abdominal pain.  Diagnoses and all orders for this visit:  Essential hypertension -     furosemide (LASIX) 40 MG tablet; Take 1 tablet (40 mg total) by mouth daily. -     potassium chloride SA (K-DUR,KLOR-CON) 20 MEQ tablet; Take 1 tablet (20 mEq total) by mouth daily.  Chronic venous insufficiency -     furosemide (LASIX) 40 MG tablet; Take 1 tablet (40 mg total) by mouth daily. -     potassium chloride SA (K-DUR,KLOR-CON) 20 MEQ tablet; Take 1 tablet (20 mEq total) by mouth daily.  Epigastric pain -     Lipase -     COMPLETE METABOLIC PANEL WITH GFR -     CBC -     ranitidine (ZANTAC) 300 MG tablet; Take 1 tablet (300 mg total) by mouth at bedtime.  Chronic allergic rhinitis, unspecified seasonality, unspecified trigger -     fexofenadine (ALLEGRA ALLERGY) 180 MG tablet; Take 1 tablet (180 mg total) by mouth daily.   F/u in 2-3  weeks for BP check   Dr. Adrian Blackwater

## 2016-02-09 DIAGNOSIS — R1013 Epigastric pain: Secondary | ICD-10-CM | POA: Insufficient documentation

## 2016-02-09 LAB — COMPLETE METABOLIC PANEL WITH GFR
ALT: 16 U/L (ref 6–29)
AST: 20 U/L (ref 10–35)
Albumin: 4.1 g/dL (ref 3.6–5.1)
Alkaline Phosphatase: 91 U/L (ref 33–130)
BILIRUBIN TOTAL: 0.4 mg/dL (ref 0.2–1.2)
BUN: 13 mg/dL (ref 7–25)
CHLORIDE: 102 mmol/L (ref 98–110)
CO2: 28 mmol/L (ref 20–31)
Calcium: 9.1 mg/dL (ref 8.6–10.4)
Creat: 0.88 mg/dL (ref 0.60–0.93)
GFR, EST AFRICAN AMERICAN: 74 mL/min (ref >=60)
GFR, EST NON AFRICAN AMERICAN: 64 mL/min (ref >=60)
GLUCOSE: 109 mg/dL — AB (ref 65–99)
Potassium: 4 mmol/L (ref 3.5–5.3)
SODIUM: 141 mmol/L (ref 135–146)
TOTAL PROTEIN: 6.7 g/dL (ref 6.1–8.1)

## 2016-02-09 LAB — LIPASE: LIPASE: 46 U/L (ref 7–60)

## 2016-02-09 NOTE — Assessment & Plan Note (Signed)
Uncontrolled with diastolic dysfunction, venous insufficiency Add back lasix 40 mg daily

## 2016-02-09 NOTE — Assessment & Plan Note (Signed)
Not taking 5 mg zyrtec, could not find it 10 mg zyrtec made her sleepy  Add allegra

## 2016-02-09 NOTE — Assessment & Plan Note (Signed)
Epigastric pain Check CMP and lipase Add zantac

## 2016-02-14 ENCOUNTER — Telehealth: Payer: Self-pay

## 2016-02-14 NOTE — Telephone Encounter (Signed)
Sal [id number] called and a VM was left informing pt of lab results.

## 2016-03-04 ENCOUNTER — Ambulatory Visit: Payer: Self-pay | Admitting: Family Medicine

## 2016-03-05 ENCOUNTER — Encounter: Payer: Self-pay | Admitting: Family Medicine

## 2016-03-05 ENCOUNTER — Ambulatory Visit: Payer: Self-pay | Attending: Family Medicine | Admitting: Family Medicine

## 2016-03-05 VITALS — BP 184/98 | HR 79 | Temp 98.4°F | Ht 59.0 in | Wt 172.2 lb

## 2016-03-05 DIAGNOSIS — R0789 Other chest pain: Secondary | ICD-10-CM

## 2016-03-05 DIAGNOSIS — R51 Headache: Secondary | ICD-10-CM | POA: Insufficient documentation

## 2016-03-05 DIAGNOSIS — Z7982 Long term (current) use of aspirin: Secondary | ICD-10-CM | POA: Insufficient documentation

## 2016-03-05 DIAGNOSIS — E785 Hyperlipidemia, unspecified: Secondary | ICD-10-CM

## 2016-03-05 DIAGNOSIS — M549 Dorsalgia, unspecified: Secondary | ICD-10-CM | POA: Insufficient documentation

## 2016-03-05 DIAGNOSIS — I872 Venous insufficiency (chronic) (peripheral): Secondary | ICD-10-CM

## 2016-03-05 DIAGNOSIS — G8929 Other chronic pain: Secondary | ICD-10-CM

## 2016-03-05 DIAGNOSIS — J309 Allergic rhinitis, unspecified: Secondary | ICD-10-CM

## 2016-03-05 DIAGNOSIS — Z8673 Personal history of transient ischemic attack (TIA), and cerebral infarction without residual deficits: Secondary | ICD-10-CM

## 2016-03-05 DIAGNOSIS — I10 Essential (primary) hypertension: Secondary | ICD-10-CM

## 2016-03-05 DIAGNOSIS — Z79899 Other long term (current) drug therapy: Secondary | ICD-10-CM | POA: Insufficient documentation

## 2016-03-05 MED ORDER — ADULT BLOOD PRESSURE CUFF LG KIT: 1.0000 | PACK | 0 refills | Status: AC

## 2016-03-05 MED ORDER — ATORVASTATIN CALCIUM 40 MG PO TABS: 40.0000 mg | ORAL_TABLET | Freq: Every day | ORAL | 1 refills | Status: AC

## 2016-03-05 MED ORDER — FUROSEMIDE 40 MG PO TABS: 40.0000 mg | ORAL_TABLET | Freq: Every day | ORAL | 3 refills | Status: AC

## 2016-03-05 MED ORDER — CARVEDILOL 12.5 MG PO TABS: 12.5000 mg | ORAL_TABLET | Freq: Two times a day (BID) | ORAL | 2 refills | Status: DC

## 2016-03-05 MED ORDER — POTASSIUM CHLORIDE CRYS ER 20 MEQ PO TBCR: 20.0000 meq | EXTENDED_RELEASE_TABLET | Freq: Every day | ORAL | 3 refills | Status: AC

## 2016-03-05 MED ORDER — CELECOXIB 100 MG PO CAPS: 100.0000 mg | ORAL_CAPSULE | Freq: Two times a day (BID) | ORAL | 2 refills | Status: AC

## 2016-03-05 MED ORDER — ASPIRIN EC 81 MG PO TBEC: 81.0000 mg | DELAYED_RELEASE_TABLET | Freq: Every day | ORAL | 3 refills | Status: AC

## 2016-03-05 MED ORDER — FEXOFENADINE HCL 180 MG PO TABS: 180.0000 mg | ORAL_TABLET | Freq: Every day | ORAL | 0 refills | Status: AC

## 2016-03-05 MED ORDER — CARVEDILOL 6.25 MG PO TABS: 6.2500 mg | ORAL_TABLET | Freq: Two times a day (BID) | ORAL | 3 refills | Status: DC

## 2016-03-05 MED ORDER — ACETAMINOPHEN ER 650 MG PO TBCR: 650.0000 mg | EXTENDED_RELEASE_TABLET | Freq: Three times a day (TID) | ORAL | 2 refills | Status: AC | PRN

## 2016-03-05 MED FILL — FLUTICASONE PROP 50 MCG SPR: 50 | 30 days supply | Qty: 16 | Fill #1

## 2016-03-05 MED FILL — FUROSEMIDE 40 MG TABLET: 40 | 90 days supply | Qty: 90 | Fill #0

## 2016-03-05 MED FILL — ATORVASTATIN 40 MG TABLET: 40 | 90 days supply | Qty: 90 | Fill #0

## 2016-03-05 MED FILL — CELECOXIB 100 MG CAPSULE: 100 | 30 days supply | Qty: 60 | Fill #0

## 2016-03-05 MED FILL — CARVEDILOL 6.25 MG TABLET: 6.25 | 30 days supply | Qty: 60 | Fill #0

## 2016-03-05 MED FILL — POLYETHYLENE GLYCOL 3350: 30 days supply | Qty: 510 | Fill #1

## 2016-03-05 NOTE — Patient Instructions (Addendum)
Sabrina Wilcox was seen today for hypertension.  Diagnoses and all orders for this visit:  Chronic bilateral back pain, unspecified back location -     celecoxib (CELEBREX) 100 MG capsule; Take 1 capsule (100 mg total) by mouth 2 (two) times daily. -     acetaminophen (TYLENOL 8 HOUR) 650 MG CR tablet; Take 1 tablet (650 mg total) by mouth every 8 (eight) hours as needed for pain.  Chest pressure -     Discontinue: carvedilol (COREG) 12.5 MG tablet; Take 1 tablet (12.5 mg total) by mouth 2 (two) times daily with a meal.  Essential hypertension -     Discontinue: carvedilol (COREG) 12.5 MG tablet; Take 1 tablet (12.5 mg total) by mouth 2 (two) times daily with a meal. -     BASIC METABOLIC PANEL WITH GFR -     carvedilol (COREG) 6.25 MG tablet; Take 1 tablet (6.25 mg total) by mouth 2 (two) times daily with a meal. -     furosemide (LASIX) 40 MG tablet; Take 1 tablet (40 mg total) by mouth daily. -     potassium chloride SA (K-DUR,KLOR-CON) 20 MEQ tablet; Take 1 tablet (20 mEq total) by mouth daily. -     Blood Pressure Monitoring (ADULT BLOOD PRESSURE CUFF LG) KIT; 1 each by Does not apply route every morning.  Chronic allergic rhinitis, unspecified seasonality, unspecified trigger -     fexofenadine (ALLEGRA ALLERGY) 180 MG tablet; Take 1 tablet (180 mg total) by mouth daily.  Chronic venous insufficiency -     furosemide (LASIX) 40 MG tablet; Take 1 tablet (40 mg total) by mouth daily. -     potassium chloride SA (K-DUR,KLOR-CON) 20 MEQ tablet; Take 1 tablet (20 mEq total) by mouth daily.  History of stroke -     atorvastatin (LIPITOR) 40 MG tablet; Take 1 tablet (40 mg total) by mouth daily. -     aspirin EC 81 MG tablet; Take 1 tablet (81 mg total) by mouth daily.  Hyperlipidemia, unspecified hyperlipidemia type -     atorvastatin (LIPITOR) 40 MG tablet; Take 1 tablet (40 mg total) by mouth daily.   Do not take Lipitor for next 2 weeks to help determine if Lipitor is contributing to  pains  Stop tramadol Take celebrex with food for body aches and headaches   Return in 2 weeks for BP recheck on 03/18/2016 for HTN follow up, 3 month supply of medications   Dr. Adrian Blackwater

## 2016-03-05 NOTE — Progress Notes (Signed)
Subjective:  Patient ID: Sabrina Wilcox, female    DOB: [birth year]  Age: 75 y.o. MRN: [id number]  CC: Hypertension   HPI Sabrina Wilcox presents with her daughter for   1. CHRONIC HYPERTENSION  Disease Monitoring  Blood pressure range: not checking, no monitor   Chest pain: no   Dyspnea: no   Claudication: no   Medication compliance: yes but only coreg 3.125 mg BID, she is traveling to [place] at the end of the month and in need of a 90 day supply of medications  Medication Side Effects  Lightheadedness: no   Urinary frequency: no   Edema: no    2. Chronic pain: pain all over her body. Taking tramadol and tylenol without control of pain. Has chronic headache.   Social History  Substance Use Topics  . Smoking status: Never Smoker  . Smokeless tobacco: Never Used  . Alcohol use No    Outpatient Medications Prior to Visit  Medication Sig Dispense Refill  . acetaminophen (TYLENOL 8 HOUR) 650 MG CR tablet Take 1 tablet (650 mg total) by mouth every 8 (eight) hours as needed for pain. 60 tablet 2  . aspirin EC 81 MG tablet Take 1 tablet (81 mg total) by mouth daily. 30 tablet 11  . atorvastatin (LIPITOR) 40 MG tablet Take 1 tablet (40 mg total) by mouth daily. 90 tablet 1  . Calcium Citrate 200 MG TABS Take 2 tablets (400 mg total) by mouth daily.  0  . carvedilol (COREG) 6.25 MG tablet Take 1 tablet (6.25 mg total) by mouth 2 (two) times daily with a meal. 60 tablet 3  . Cholecalciferol (VITAMIN D3) 2000 units TABS Take 2,000 Units by mouth daily. 30 tablet 11  . Elastic Bandages & Supports (MEDICAL COMPRESSION STOCKINGS) MISC 30 mm Hg pressure 2 each 0  . fexofenadine (ALLEGRA ALLERGY) 180 MG tablet Take 1 tablet (180 mg total) by mouth daily. 30 tablet 5  . fluticasone (FLONASE) 50 MCG/ACT nasal spray Place 2 sprays into both nostrils daily. 16 g 6  . furosemide (LASIX) 40 MG tablet Take 1 tablet (40 mg total) by mouth daily. 30 tablet 5  . polyethylene glycol  powder (GLYCOLAX/MIRALAX) powder Take 17 g by mouth daily. 3350 g 1  . potassium chloride SA (K-DUR,KLOR-CON) 20 MEQ tablet Take 1 tablet (20 mEq total) by mouth daily. 30 tablet 5  . traMADol (ULTRAM) 50 MG tablet Take 1 tablet (50 mg total) by mouth every 12 (twelve) hours as needed. 60 tablet 2  . ranitidine (ZANTAC) 300 MG tablet Take 1 tablet (300 mg total) by mouth at bedtime. (Patient not taking: Reported on 03/05/2016) 30 tablet 1   Facility-Administered Medications Prior to Visit  Medication Dose Route Frequency Provider Last Rate Last Dose  . aminophylline injection 75 mg  75 mg Intravenous Once Dorothy Spark, MD        ROS Review of Systems  Constitutional: Negative for chills and fever.  Eyes: Negative for visual disturbance.  Respiratory: Negative for shortness of breath.   Cardiovascular: Negative for chest pain.  Gastrointestinal: Positive for abdominal pain. Negative for blood in stool.  Musculoskeletal: Positive for arthralgias and back pain.  Skin: Negative for rash.  Allergic/Immunologic: Negative for immunocompromised state.  Neurological: Positive for headaches.  Hematological: Negative for adenopathy. Does not bruise/bleed easily.  Psychiatric/Behavioral: Negative for dysphoric mood and suicidal ideas.    Objective:  BP (!) 184/98 (BP Location: Right Arm, Patient Position: Sitting, Cuff Size:  Large)   Pulse 79   Temp 98.4 F (36.9 C) (Oral)   Ht _0  (1.499 m)   Wt 172 lb 3.2 oz (78.1 kg)   SpO2 96%   BMI 34.78 kg/m   BP/Weight 03/05/2016 02/08/2016 09/18/3557  Systolic BP 741 638 453  Diastolic BP 98 53 78  Wt. (Lbs) 172.2 171 171.2  BMI 34.78 34.54 34.58    Physical Exam  Constitutional: She is oriented to person, place, and time. She appears well-developed and well-nourished. No distress.  Elderly Hispanic female   HENT:  Head: Normocephalic and atraumatic.  Cardiovascular: Normal rate, regular rhythm, normal heart sounds and intact distal  pulses.   Pulmonary/Chest: Effort normal and breath sounds normal.  Musculoskeletal: She exhibits no edema.  Neurological: She is alert and oriented to person, place, and time.  Skin: Skin is warm and dry. No rash noted.  Psychiatric: She has a normal mood and affect.    Assessment & Plan:  Sabrina Wilcox was seen today for hypertension.  Diagnoses and all orders for this visit:  Chronic bilateral back pain, unspecified back location -     celecoxib (CELEBREX) 100 MG capsule; Take 1 capsule (100 mg total) by mouth 2 (two) times daily. -     acetaminophen (TYLENOL 8 HOUR) 650 MG CR tablet; Take 1 tablet (650 mg total) by mouth every 8 (eight) hours as needed for pain.  Chest pressure -     Discontinue: carvedilol (COREG) 12.5 MG tablet; Take 1 tablet (12.5 mg total) by mouth 2 (two) times daily with a meal.  Essential hypertension -     Discontinue: carvedilol (COREG) 12.5 MG tablet; Take 1 tablet (12.5 mg total) by mouth 2 (two) times daily with a meal. -     BASIC METABOLIC PANEL WITH GFR -     carvedilol (COREG) 6.25 MG tablet; Take 1 tablet (6.25 mg total) by mouth 2 (two) times daily with a meal. -     furosemide (LASIX) 40 MG tablet; Take 1 tablet (40 mg total) by mouth daily. -     potassium chloride SA (K-DUR,KLOR-CON) 20 MEQ tablet; Take 1 tablet (20 mEq total) by mouth daily. -     Blood Pressure Monitoring (ADULT BLOOD PRESSURE CUFF LG) KIT; 1 each by Does not apply route every morning.  Chronic allergic rhinitis, unspecified seasonality, unspecified trigger -     fexofenadine (ALLEGRA ALLERGY) 180 MG tablet; Take 1 tablet (180 mg total) by mouth daily.  Chronic venous insufficiency -     furosemide (LASIX) 40 MG tablet; Take 1 tablet (40 mg total) by mouth daily. -     potassium chloride SA (K-DUR,KLOR-CON) 20 MEQ tablet; Take 1 tablet (20 mEq total) by mouth daily.  History of stroke -     atorvastatin (LIPITOR) 40 MG tablet; Take 1 tablet (40 mg total) by mouth daily. -      aspirin EC 81 MG tablet; Take 1 tablet (81 mg total) by mouth daily.  Hyperlipidemia, unspecified hyperlipidemia type -     atorvastatin (LIPITOR) 40 MG tablet; Take 1 tablet (40 mg total) by mouth daily.   There are no diagnoses linked to this encounter.  No orders of the defined types were placed in this encounter.   Follow-up: Return in about 2 weeks (around 03/19/2016) for HTN .   Boykin Nearing MD

## 2016-03-05 NOTE — Progress Notes (Signed)
Pt is here to follow up on HTN.

## 2016-03-06 ENCOUNTER — Telehealth: Payer: Self-pay

## 2016-03-06 LAB — BASIC METABOLIC PANEL WITH GFR
BUN: 15 mg/dL (ref 7–25)
CHLORIDE: 103 mmol/L (ref 98–110)
CO2: 30 mmol/L (ref 20–31)
CREATININE: 0.95 mg/dL — AB (ref 0.60–0.93)
Calcium: 9 mg/dL (ref 8.6–10.4)
GFR, EST AFRICAN AMERICAN: 67 mL/min (ref >=60)
GFR, Est Non African American: 58 mL/min — ABNORMAL LOW (ref >=60)
Glucose, Bld: 115 mg/dL — ABNORMAL HIGH (ref 65–99)
Potassium: 4 mmol/L (ref 3.5–5.3)
Sodium: 140 mmol/L (ref 135–146)

## 2016-03-06 NOTE — Telephone Encounter (Signed)
Pt was called and informed of lab results. 

## 2016-03-06 NOTE — Assessment & Plan Note (Signed)
Chronic pain uncontrolled Stop tramadol  Add Celebrex Continue scheduled tylenol

## 2016-03-06 NOTE — Assessment & Plan Note (Signed)
uncontrolled No edema Continue lasix 40 mg daily Increase coreg to 6.25 mg BID Close f.u in 2 weeks for BP check with plan to increase coreg to 12.5 mg BID as tolerated if needed

## 2016-03-18 ENCOUNTER — Ambulatory Visit: Payer: Self-pay | Attending: Family Medicine | Admitting: Family Medicine

## 2016-03-18 VITALS — BP 171/74 | HR 65 | Temp 98.4°F | Ht 59.0 in | Wt 170.6 lb

## 2016-03-18 DIAGNOSIS — Z79899 Other long term (current) drug therapy: Secondary | ICD-10-CM | POA: Insufficient documentation

## 2016-03-18 DIAGNOSIS — R110 Nausea: Secondary | ICD-10-CM | POA: Insufficient documentation

## 2016-03-18 DIAGNOSIS — R102 Pelvic and perineal pain: Secondary | ICD-10-CM | POA: Insufficient documentation

## 2016-03-18 DIAGNOSIS — R300 Dysuria: Secondary | ICD-10-CM | POA: Insufficient documentation

## 2016-03-18 DIAGNOSIS — Z7982 Long term (current) use of aspirin: Secondary | ICD-10-CM | POA: Insufficient documentation

## 2016-03-18 DIAGNOSIS — K6289 Other specified diseases of anus and rectum: Secondary | ICD-10-CM

## 2016-03-18 DIAGNOSIS — K5900 Constipation, unspecified: Secondary | ICD-10-CM | POA: Insufficient documentation

## 2016-03-18 DIAGNOSIS — I10 Essential (primary) hypertension: Secondary | ICD-10-CM | POA: Insufficient documentation

## 2016-03-18 DIAGNOSIS — N952 Postmenopausal atrophic vaginitis: Secondary | ICD-10-CM | POA: Insufficient documentation

## 2016-03-18 LAB — POCT URINALYSIS DIPSTICK
Bilirubin, UA: NEGATIVE
Glucose, UA: NEGATIVE
Ketones, UA: NEGATIVE
NITRITE UA: NEGATIVE
PH UA: 5.5
PROTEIN UA: NEGATIVE
Spec Grav, UA: <=1.005
Urobilinogen, UA: 0.2

## 2016-03-18 MED ORDER — NITROFURANTOIN MONOHYD MACRO 100 MG PO CAPS: 100.0000 mg | ORAL_CAPSULE | Freq: Two times a day (BID) | ORAL | 0 refills | Status: AC

## 2016-03-18 MED ORDER — CARVEDILOL 12.5 MG PO TABS: 12.5000 mg | ORAL_TABLET | Freq: Two times a day (BID) | ORAL | 1 refills | Status: DC

## 2016-03-18 MED ORDER — ESTRADIOL 0.1 MG/GM VA CREA: TOPICAL_CREAM | VAGINAL | 1 refills | Status: DC

## 2016-03-18 MED ORDER — CARVEDILOL 12.5 MG PO TABS: 12.5000 mg | ORAL_TABLET | Freq: Two times a day (BID) | ORAL | 1 refills | Status: AC

## 2016-03-18 MED ORDER — ESTRADIOL 0.1 MG/GM VA CREA: TOPICAL_CREAM | VAGINAL | 1 refills | Status: AC

## 2016-03-18 MED FILL — !ESTRACE 0.01% CREAM: 0.01% | 45 days supply | Qty: 84 | Fill #0

## 2016-03-18 MED FILL — CARVEDILOL 12.5 MG TABLET: 12.5 | 90 days supply | Qty: 180 | Fill #0

## 2016-03-18 MED FILL — NITROFURANTOIN MONO-MCR 100: 100 | 3 days supply | Qty: 6 | Fill #0

## 2016-03-18 NOTE — Progress Notes (Signed)
PT IS HERE FOR bp CHECK. PT STATES THAT SHE IS HAVING SOME BURNING IN HER LOWER ABDOMEN.

## 2016-03-18 NOTE — Progress Notes (Signed)
Subjective:  Patient ID: Sabrina Wilcox, female    DOB: 1940-04-08  Age: 75 y.o. MRN: [id number]  CC: Hypertension   HPI Sabrina Wilcox presents with her daughter for   1. CHRONIC HYPERTENSION  Disease Monitoring  Blood pressure range: not checking, no monitor   Chest pain: no   Dyspnea: no   Claudication: no   Medication compliance: yes. She travels to [place] in 2 days and needs a 90 day supply of medications  Medication Side Effects  Lightheadedness: no   Urinary frequency: no   Edema: no    2. Chronic pain: pain all over her body. Has started celebrex with food which has helped with knee and pain.   3. Lower abdominal pain: burning in lower pelvis, sharp pains, dysuria. Has intermittent constipation. Mild nausea. No emesis. No diarrhea. No blood in stool. She has some anal leakage. She has note of abnormal ana; exam during screening c-scope. She denies hx of anal surgery. She has hx of hysterectomy.   Social History  Substance Use Topics  . Smoking status: Never Smoker  . Smokeless tobacco: Never Used  . Alcohol use No    Outpatient Medications Prior to Visit  Medication Sig Dispense Refill  . acetaminophen (TYLENOL 8 HOUR) 650 MG CR tablet Take 1 tablet (650 mg total) by mouth every 8 (eight) hours as needed for pain. 180 tablet 2  . aspirin EC 81 MG tablet Take 1 tablet (81 mg total) by mouth daily. 90 tablet 3  . atorvastatin (LIPITOR) 40 MG tablet Take 1 tablet (40 mg total) by mouth daily. 90 tablet 1  . Blood Pressure Monitoring (ADULT BLOOD PRESSURE CUFF LG) KIT 1 each by Does not apply route every morning. 1 each 0  . Calcium Citrate 200 MG TABS Take 2 tablets (400 mg total) by mouth daily.  0  . carvedilol (COREG) 6.25 MG tablet Take 1 tablet (6.25 mg total) by mouth 2 (two) times daily with a meal. 60 tablet 3  . celecoxib (CELEBREX) 100 MG capsule Take 1 capsule (100 mg total) by mouth 2 (two) times daily. 60 capsule 2  . Cholecalciferol (VITAMIN  D3) 2000 units TABS Take 2,000 Units by mouth daily. 30 tablet 11  . Elastic Bandages & Supports (MEDICAL COMPRESSION STOCKINGS) MISC 30 mm Hg pressure 2 each 0  . fexofenadine (ALLEGRA ALLERGY) 180 MG tablet Take 1 tablet (180 mg total) by mouth daily. 30 tablet 0  . fluticasone (FLONASE) 50 MCG/ACT nasal spray Place 2 sprays into both nostrils daily. 16 g 6  . furosemide (LASIX) 40 MG tablet Take 1 tablet (40 mg total) by mouth daily. 90 tablet 3  . polyethylene glycol powder (GLYCOLAX/MIRALAX) powder Take 17 g by mouth daily. 3350 g 1  . potassium chloride SA (K-DUR,KLOR-CON) 20 MEQ tablet Take 1 tablet (20 mEq total) by mouth daily. 90 tablet 3   Facility-Administered Medications Prior to Visit  Medication Dose Route Frequency Provider Last Rate Last Dose  . aminophylline injection 75 mg  75 mg Intravenous Once Dorothy Spark, MD        ROS Review of Systems  Constitutional: Negative for chills and fever.  Eyes: Negative for visual disturbance.  Respiratory: Negative for shortness of breath.   Cardiovascular: Negative for chest pain.  Gastrointestinal: Positive for abdominal pain, constipation and nausea. Negative for abdominal distention, anal bleeding, blood in stool, diarrhea, rectal pain and vomiting.  Genitourinary: Positive for dysuria.  Musculoskeletal: Positive for arthralgias and  back pain.  Skin: Negative for rash.  Allergic/Immunologic: Negative for immunocompromised state.  Neurological: Positive for headaches.  Hematological: Negative for adenopathy. Does not bruise/bleed easily.  Psychiatric/Behavioral: Negative for dysphoric mood and suicidal ideas.    Objective:  BP (!) 171/74 (BP Location: Left Arm, Patient Position: Sitting, Cuff Size: Small)   Pulse 65   Temp 98.4 F (36.9 C) (Oral)   Ht _0  (1.499 m)   Wt 170 lb 9.6 oz (77.4 kg)   SpO2 98%   BMI 34.46 kg/m   BP/Weight 03/18/2016 03/05/2016 93/09/3426  Systolic BP 768 115 726  Diastolic BP 74 98 53   Wt. (Lbs) 170.6 172.2 171  BMI 34.46 34.78 34.54   Pulse Readings from Last 3 Encounters:  03/18/16 65  03/05/16 79  02/08/16 63    Physical Exam  Constitutional: She is oriented to person, place, and time. She appears well-developed and well-nourished. No distress.  Elderly Hispanic female   HENT:  Head: Normocephalic and atraumatic.  Cardiovascular: Normal rate, regular rhythm, normal heart sounds and intact distal pulses.   Pulmonary/Chest: Effort normal and breath sounds normal.  Abdominal: Soft. Bowel sounds are normal. She exhibits no distension and no mass. There is tenderness in the right lower quadrant, suprapubic area and left lower quadrant. There is no rebound and no guarding.  Genitourinary: Rectal exam shows anal tone abnormal (decreased tone ).     Musculoskeletal: She exhibits no edema.  Neurological: She is alert and oriented to person, place, and time.  Skin: Skin is warm and dry. No rash noted.  Psychiatric: She has a normal mood and affect.   UA: moderate LE, small RBC  Assessment & Plan:  Sabrina Wilcox was seen today for hypertension.  Diagnoses and all orders for this visit:  Atrophic vaginitis -     Discontinue: estradiol (ESTRACE VAGINAL) 0.1 MG/GM vaginal cream; Insert 2 grams nightly per vagina for two weeks, then 1 gram nightly  for two weeks, then 1 gram 3 night per week -     estradiol (ESTRACE VAGINAL) 0.1 MG/GM vaginal cream; Insert 2 grams nightly per vagina for two weeks, then 1 gram nightly  for two weeks, then 1 gram 3 night per week  Essential hypertension -     Discontinue: carvedilol (COREG) 12.5 MG tablet; Take 1 tablet (12.5 mg total) by mouth 2 (two) times daily with a meal. -     carvedilol (COREG) 12.5 MG tablet; Take 1 tablet (12.5 mg total) by mouth 2 (two) times daily with a meal.  Suprapubic pain -     POCT urinalysis dipstick -     Urine culture -     nitrofurantoin, macrocrystal-monohydrate, (MACROBID) 100 MG capsule; Take 1  capsule (100 mg total) by mouth 2 (two) times daily.   There are no diagnoses linked to this encounter.  No orders of the defined types were placed in this encounter.   Follow-up: Return in about 3 months (around 06/16/2016) for HTN and abdominal pain .   Boykin Nearing MD

## 2016-03-18 NOTE — Patient Instructions (Addendum)
Sabrina Wilcox was seen today for hypertension.  Diagnoses and all orders for this visit:  Atrophic vaginitis -     Discontinue: estradiol (ESTRACE VAGINAL) 0.1 MG/GM vaginal cream; Insert 2 grams nightly per vagina for two weeks, then 1 gram nightly  for two weeks, then 1 gram 3 night per week -     estradiol (ESTRACE VAGINAL) 0.1 MG/GM vaginal cream; Insert 2 grams nightly per vagina for two weeks, then 1 gram nightly  for two weeks, then 1 gram 3 night per week  Essential hypertension -     Discontinue: carvedilol (COREG) 12.5 MG tablet; Take 1 tablet (12.5 mg total) by mouth 2 (two) times daily with a meal. -     carvedilol (COREG) 12.5 MG tablet; Take 1 tablet (12.5 mg total) by mouth 2 (two) times daily with a meal.  Suprapubic pain -     POCT urinalysis dipstick -     Urine culture -     nitrofurantoin, macrocrystal-monohydrate, (MACROBID) 100 MG capsule; Take 1 capsule (100 mg total) by mouth 2 (two) times daily.   Increase coreg to 12.5 mg twice daily  F/u in 3 months, have a wonderful trip  Dr. Adrian Blackwater

## 2016-03-19 ENCOUNTER — Encounter: Payer: Self-pay | Admitting: Family Medicine

## 2016-03-19 DIAGNOSIS — N952 Postmenopausal atrophic vaginitis: Secondary | ICD-10-CM | POA: Insufficient documentation

## 2016-03-19 DIAGNOSIS — R102 Pelvic and perineal pain: Secondary | ICD-10-CM | POA: Insufficient documentation

## 2016-03-19 DIAGNOSIS — K6289 Other specified diseases of anus and rectum: Secondary | ICD-10-CM | POA: Insufficient documentation

## 2016-03-19 DIAGNOSIS — R1024 Suprapubic pain: Secondary | ICD-10-CM | POA: Insufficient documentation

## 2016-03-19 NOTE — Assessment & Plan Note (Signed)
Pelvic pains with evidence of atropic vaginitis Suspect UTI as well Treat with Macrobid Add vaginal estrogen  Urine culture pending

## 2016-03-19 NOTE — Assessment & Plan Note (Signed)
Remains uncontrolled Plan; Increase coreg to 12.5 mg BID Continue lasix 40 mg daily She had leg swelling with norvasc

## 2016-03-19 NOTE — Assessment & Plan Note (Signed)
Anal leakage from decrease tone Plan stool bulking- bran or fiber

## 2016-03-21 LAB — URINE CULTURE

## 2016-03-27 ENCOUNTER — Telehealth: Payer: Self-pay

## 2016-03-27 NOTE — Telephone Encounter (Signed)
Pt was [phone]) and a VM was left informing pt of lab results, pt already received medication for E.Coli at last Ov. DPR on file.

## 2016-08-14 ENCOUNTER — Encounter: Payer: Self-pay | Admitting: Family Medicine

## 2017-02-04 IMAGING — CR DG RIBS W/ CHEST 3+V*L*
5 series · 5 of 5 positions shown · non-contrast
Comparison: None.

CLINICAL DATA: 74-year-old female with fall and left-sided chest
pain

EXAM:
LEFT RIBS AND CHEST - 3+ VIEW

[t chest supine]
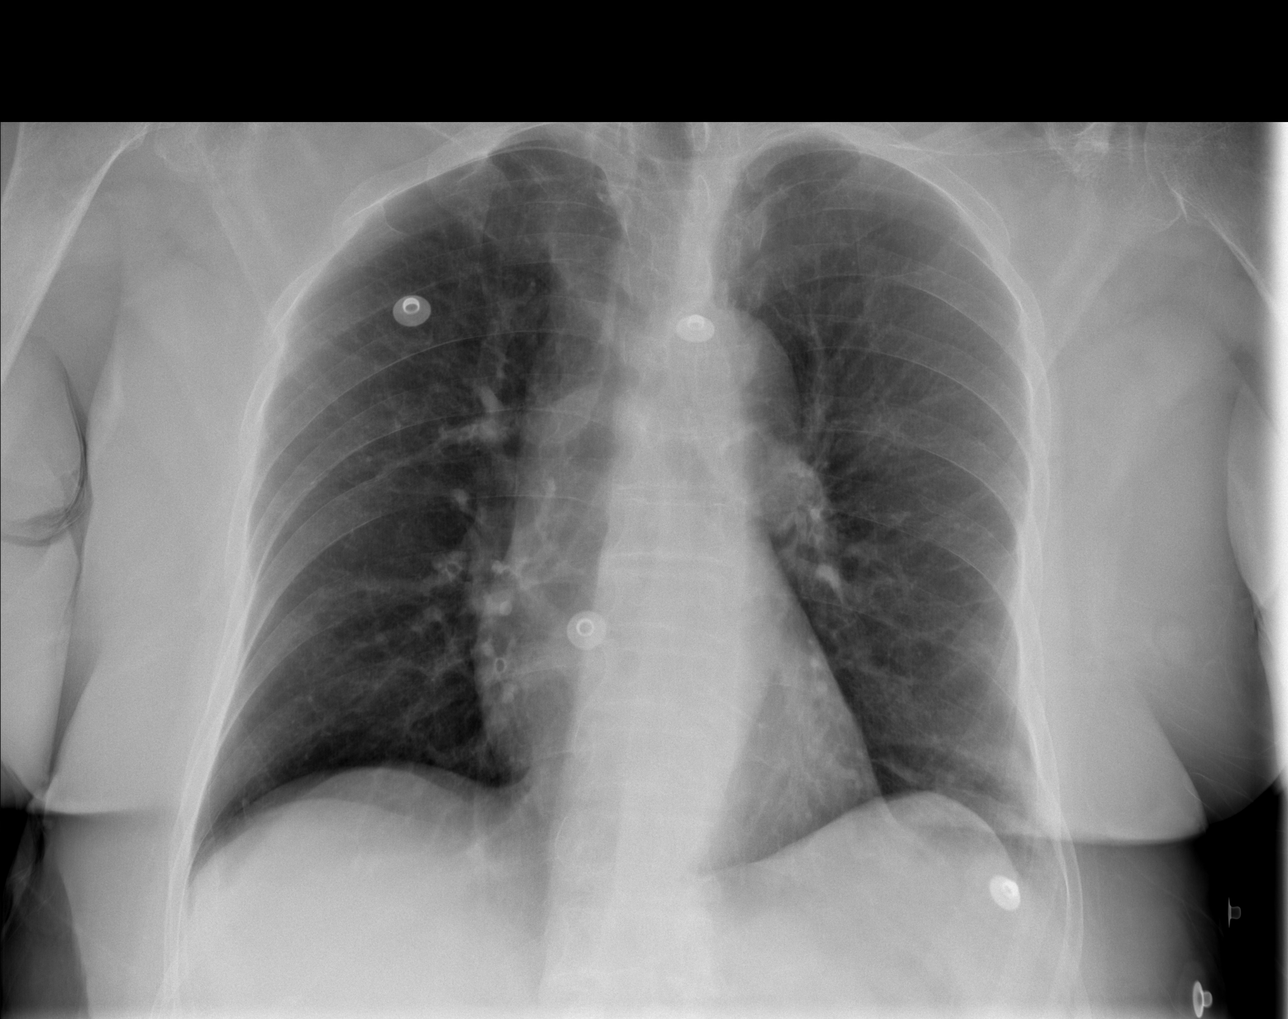

[t ribs ap upper left]
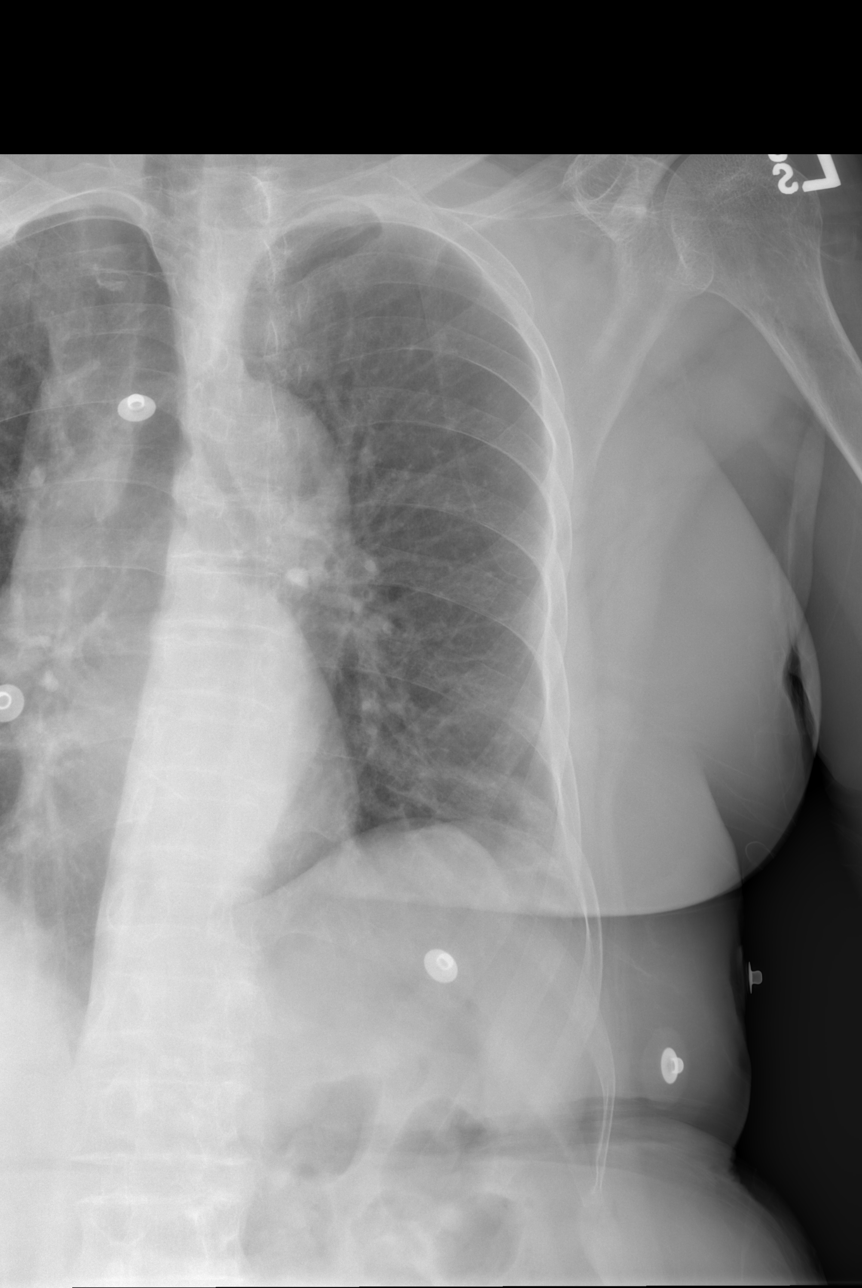

[t ribs ap lower left]
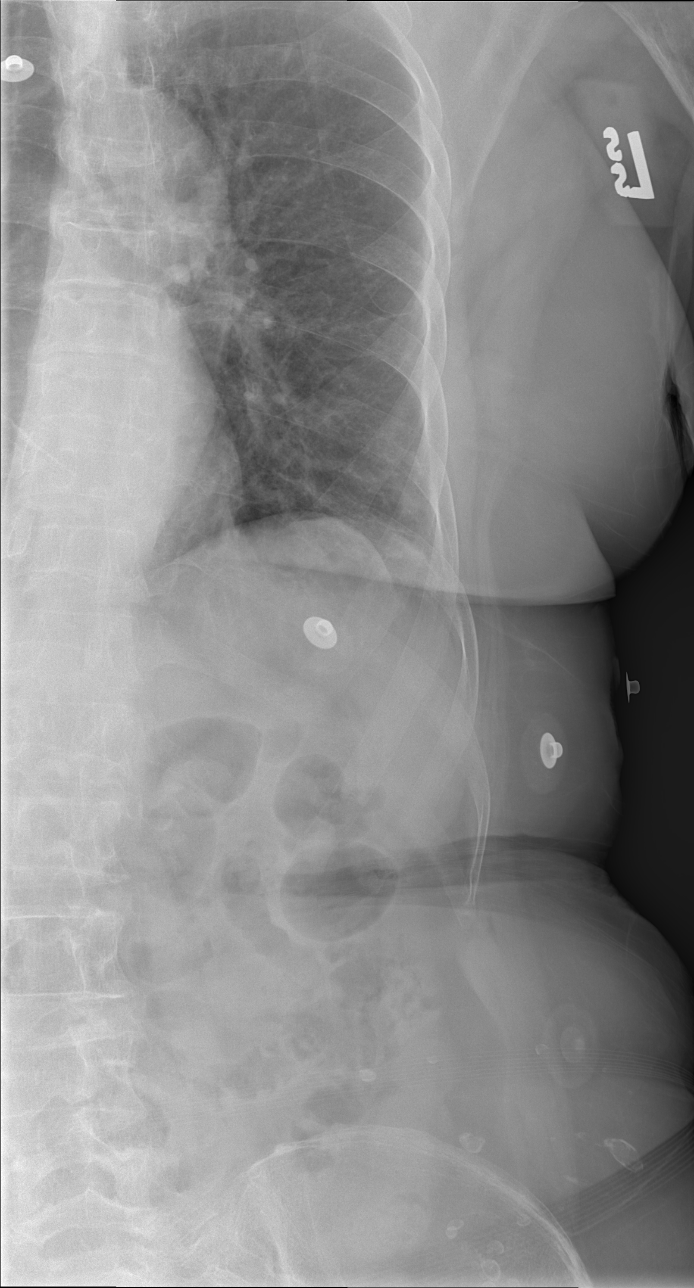

[t ribs lpo left (1 of 2)]
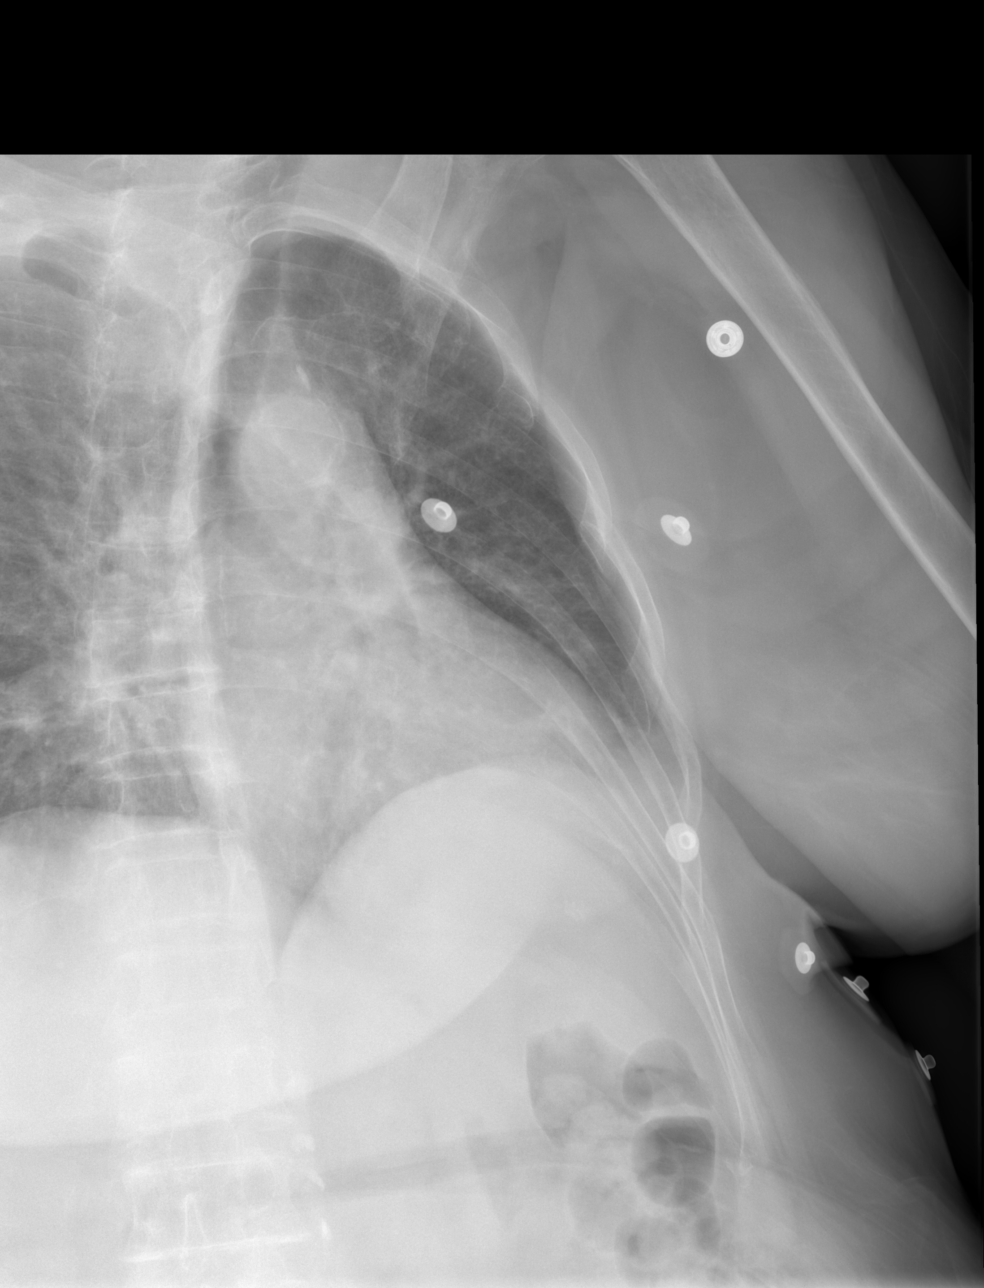

[t ribs lpo left (2 of 2)]
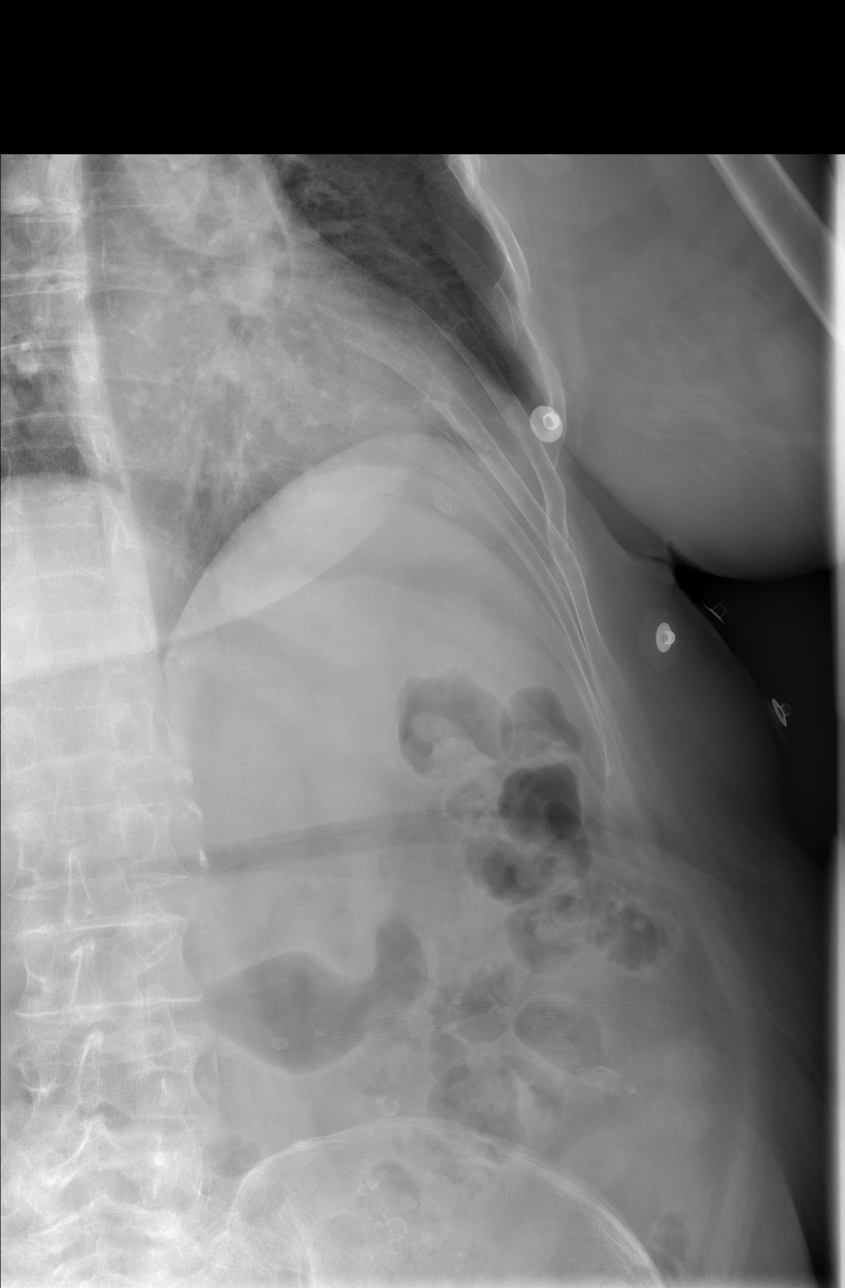

[5 of 5 positions shown; findings below may reference images not displayed]

FINDINGS: The bones are osteopenic. There is no definite rib fracture. No
pneumothorax identified. There is emphysematous changes of the
lungs. No focal consolidation, or pleural effusion. The cardiac
silhouette is within normal limits.
IMPRESSION: No acute rib fracture or pneumothorax.

## 2017-02-04 IMAGING — MR MR HEAD W/O CM
9 of 10 series · 36 of 48 positions shown · non-contrast
Comparison: CT head June 03, 2015 at 7418 hours

CLINICAL DATA: Posterior head pain after fall. Acute onset
confusion, slurred speech.

EXAM:
MRI HEAD WITHOUT CONTRAST
TECHNIQUE: Multiplanar, multiecho pulse sequences of the brain and surrounding
structures were obtained without intravenous contrast.

[Series 3: T1 · sagittal · 5.0mm · 0.47mm/px · 2 of 25 slices shown]
[im 1/25]
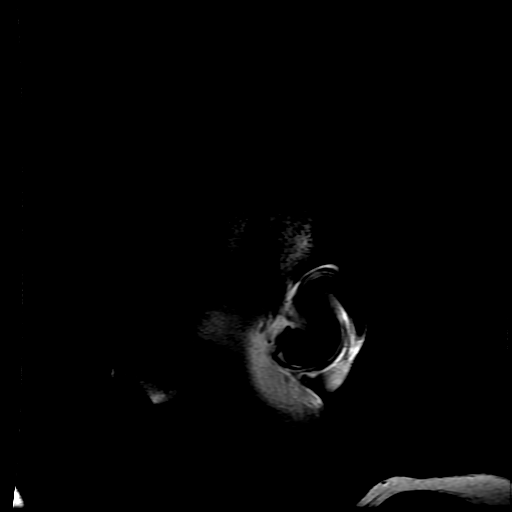
[im 25/25]
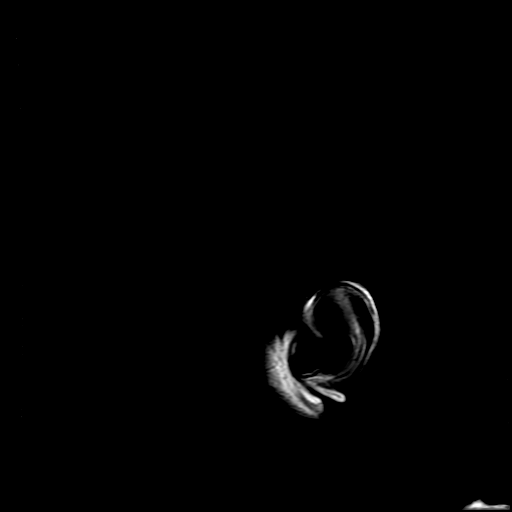

[Series 4: DWI · axial · 3.0mm · 1.09mm/px · z∈[-52,+92]mm · 9 of 98 slices shown (1 of 4)]
[im 1/98]
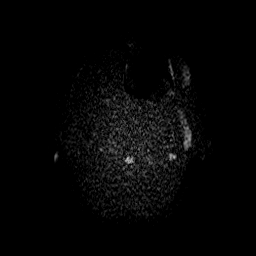
[im 13/98]
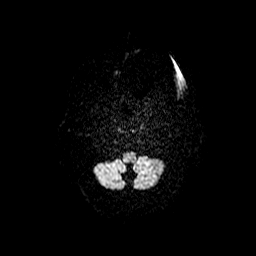
[im 25/98]
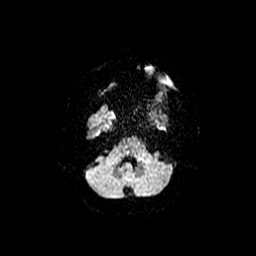
[im 37/98]
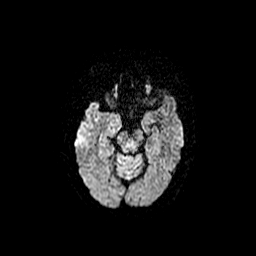
[im 49/98]
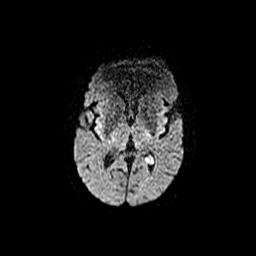
[im 61/98]
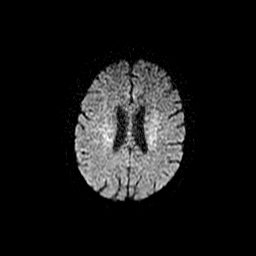
[im 73/98]
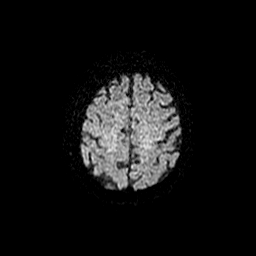
[im 85/98]
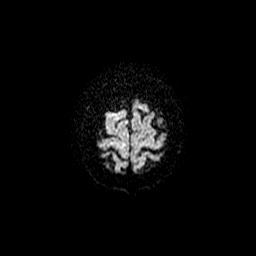
[im 98/98]
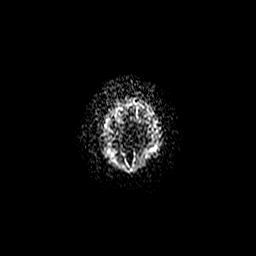

[Series 5: DWI · coronal · 5.0mm · 1.09mm/px · 7 of 66 slices shown (2 of 4)]
[im 1/66]
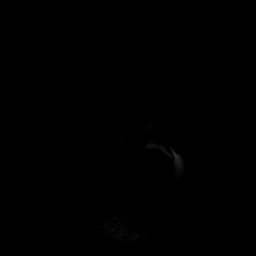
[im 11/66]
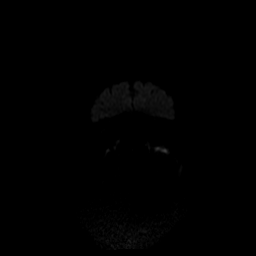
[im 22/66]
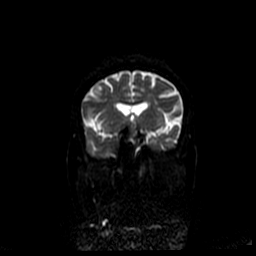
[im 33/66]
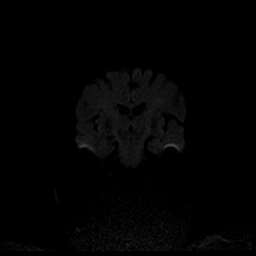
[im 44/66]
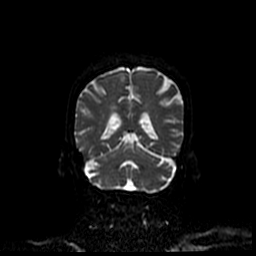
[im 55/66]
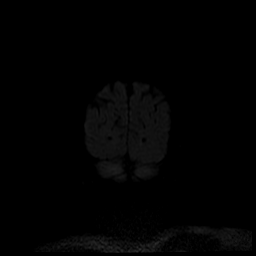
[im 66/66]
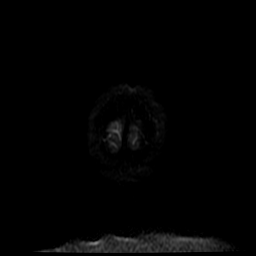

[Series 6: T2 · axial · 5.0mm · 0.43mm/px · z∈[-54,+89]mm · 3 of 25 slices shown (1 of 2)]
[im 1/25]
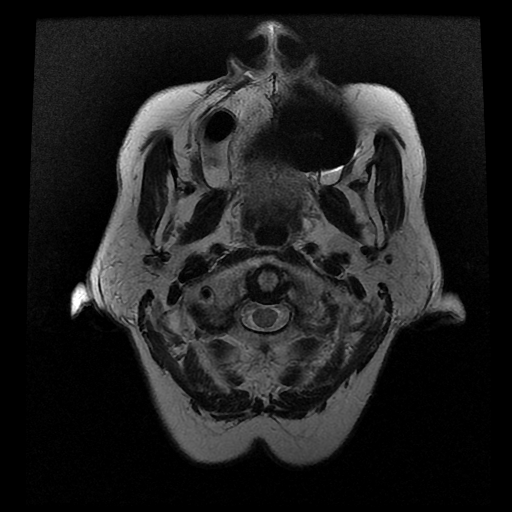
[im 13/25]
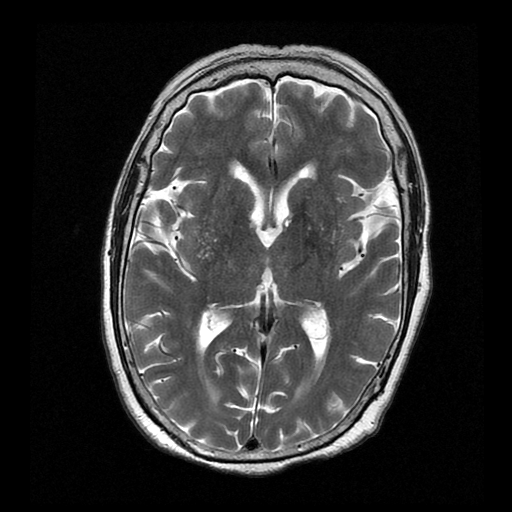
[im 25/25]
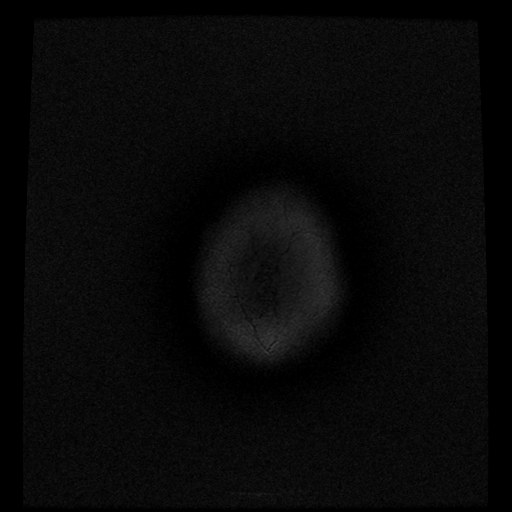

[Series 7: FLAIR · axial · 5.0mm · 0.43mm/px · z∈[-54,+89]mm · 3 of 25 slices shown]
[im 1/25]
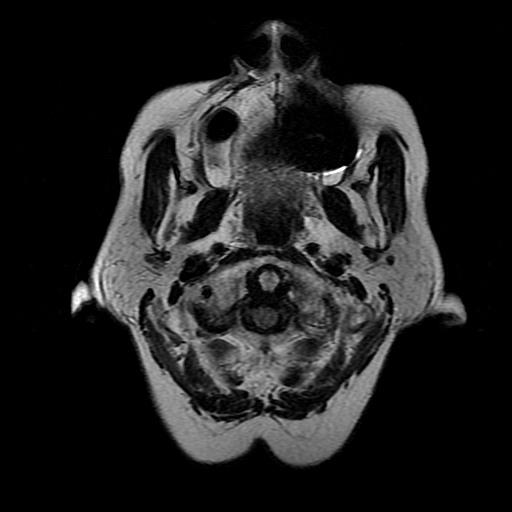
[im 13/25]
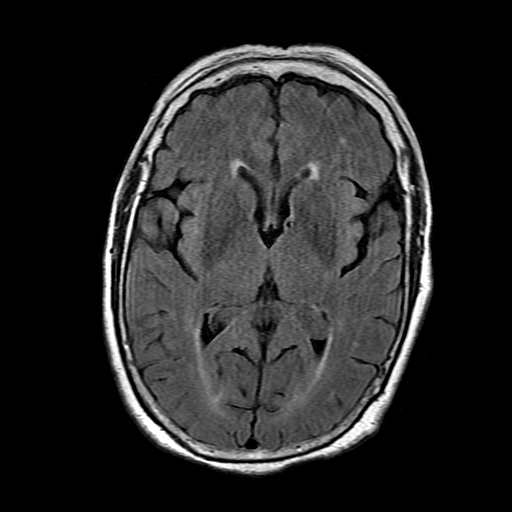
[im 25/25]
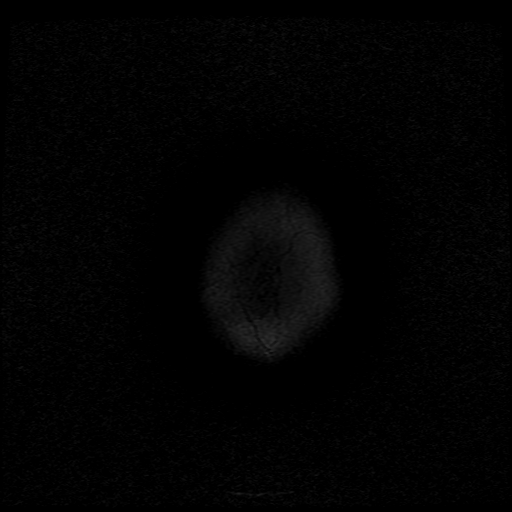

[Series 8: ax mpgr · axial · 5.0mm · 0.43mm/px · 1 of 25 slices shown]
[im 1/25]
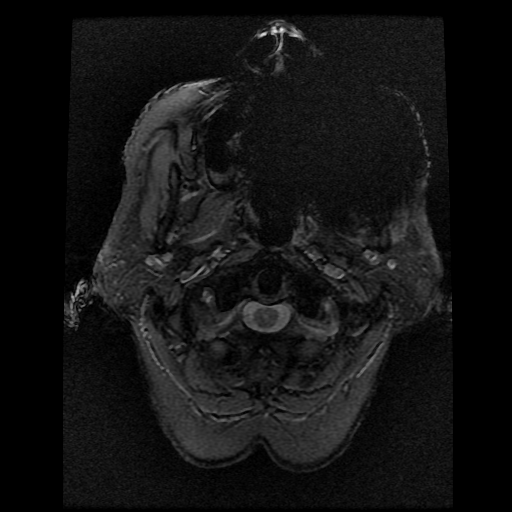

[Series 10: T2 · coronal · 5.0mm · 0.39mm/px · 3 of 25 slices shown (2 of 2)]
[im 1/25]
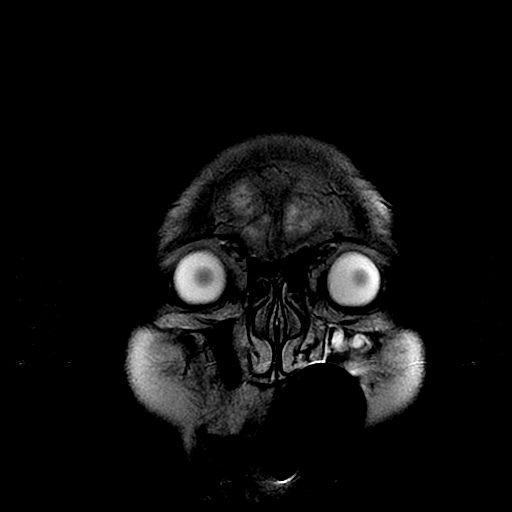
[im 13/25]
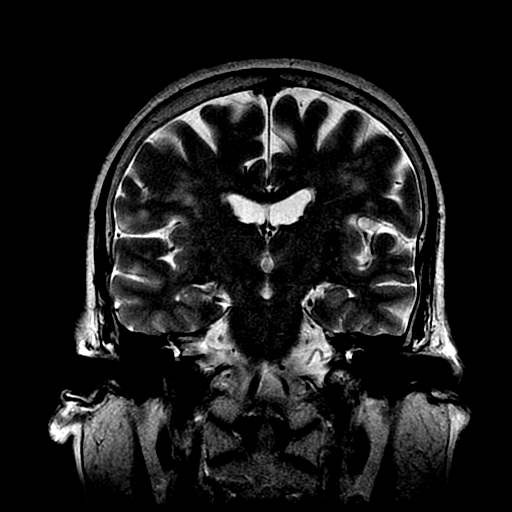
[im 25/25]
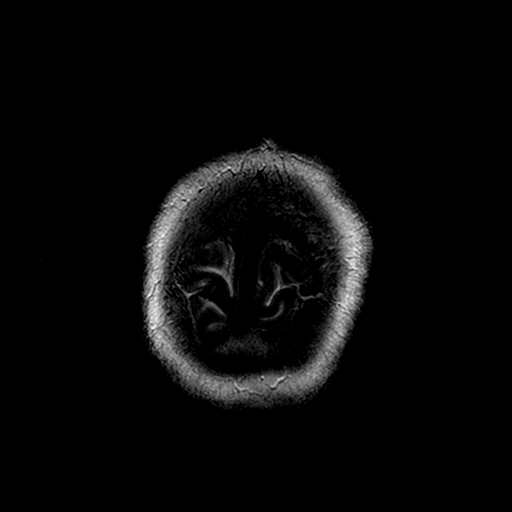

[Series 400: DWI · axial · 3.0mm · 1.09mm/px · z∈[-52,+92]mm · 5 of 49 slices shown (3 of 4)]
[im 1/49]
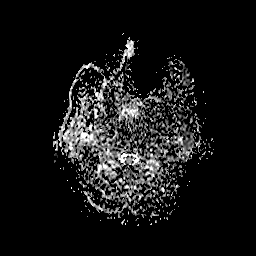
[im 13/49]
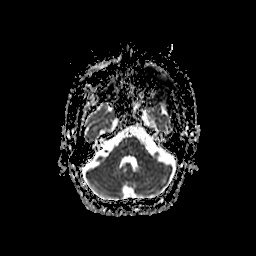
[im 25/49]
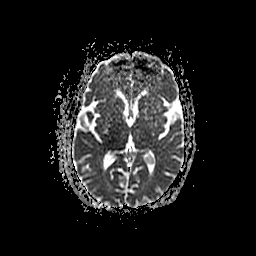
[im 37/49]
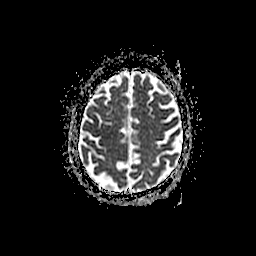
[im 49/49]
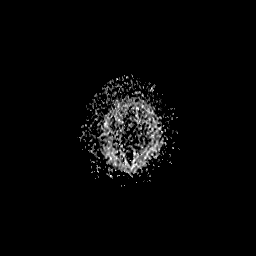

[Series 500: DWI · coronal · 5.0mm · 1.09mm/px · 3 of 33 slices shown (4 of 4)]
[im 1/33]
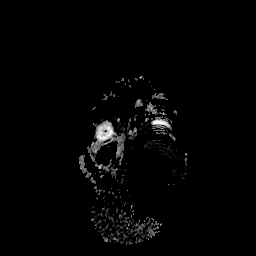
[im 17/33]
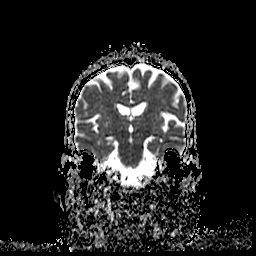
[im 33/33]
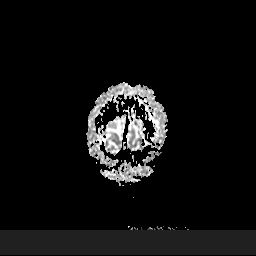

[36 of 48 positions shown; findings below may reference images not displayed]

FINDINGS: The ventricles and sulci are normal for patient's age. No abnormal
parenchymal signal, mass lesions, mass effect. Patchy supratentorial
white matter FLAIR T2 hyperintensities. Prominent, normal basal
ganglia perivascular spaces. No reduced diffusion to suggest acute
ischemia. No susceptibility artifact to suggest hemorrhage.

No abnormal extra-axial fluid collections. No extra-axial masses
though, contrast enhanced sequences would be more sensitive. Normal
major intracranial vascular flow voids seen at the skull base.

Ocular globes and orbital contents are unremarkable though not
tailored for evaluation. No abnormal sellar expansion. No suspicious
calvarial bone marrow signal. Mild pannus about the odontoid process
compatible with CPPD. Craniocervical junction maintained. Mild
paranasal sinus mucosal thickening. Mastoid air cells are well
aerated.
IMPRESSION: No acute intracranial process.

Mild to moderate chronic small vessel ischemic disease.

## 2018-09-17 ENCOUNTER — Encounter: Payer: Self-pay | Admitting: Gastroenterology
